# Patient Record
Sex: Female | Born: 1970 | Race: White | Hispanic: Yes | Marital: Single | Smoking: Never smoker
Health system: Southern US, Community
[De-identification: ages and names within clinical notes are randomized; demographics above are authoritative.]

## PROBLEM LIST (undated history)

## (undated) DIAGNOSIS — D490 Neoplasm of unspecified behavior of digestive system: Secondary | ICD-10-CM

## (undated) DIAGNOSIS — G8929 Other chronic pain: Secondary | ICD-10-CM

## (undated) DIAGNOSIS — R109 Unspecified abdominal pain: Secondary | ICD-10-CM

## (undated) DIAGNOSIS — K219 Gastro-esophageal reflux disease without esophagitis: Secondary | ICD-10-CM

## (undated) DIAGNOSIS — E079 Disorder of thyroid, unspecified: Secondary | ICD-10-CM

## (undated) HISTORY — PX: COLONOSCOPY: SHX174

---

## 2015-11-17 ENCOUNTER — Emergency Department (HOSPITAL_COMMUNITY)
Admission: EM | Admit: 2015-11-17 | Discharge: 2015-11-18 | Disposition: A | Discharge status: Home / Self Care | Payer: PRIVATE HEALTH INSURANCE | Attending: Emergency Medicine | Admitting: Emergency Medicine

## 2015-11-17 DIAGNOSIS — Z3202 Encounter for pregnancy test, result negative: Secondary | ICD-10-CM | POA: Insufficient documentation

## 2015-11-17 DIAGNOSIS — Z79899 Other long term (current) drug therapy: Secondary | ICD-10-CM | POA: Insufficient documentation

## 2015-11-17 DIAGNOSIS — K529 Noninfective gastroenteritis and colitis, unspecified: Secondary | ICD-10-CM | POA: Diagnosis not present

## 2015-11-17 DIAGNOSIS — R1012 Left upper quadrant pain: Secondary | ICD-10-CM | POA: Diagnosis present

## 2015-11-18 ENCOUNTER — Emergency Department (HOSPITAL_COMMUNITY): Payer: PRIVATE HEALTH INSURANCE

## 2015-11-18 ENCOUNTER — Encounter (HOSPITAL_COMMUNITY): Payer: Self-pay | Admitting: Family Medicine

## 2015-11-18 LAB — COMPREHENSIVE METABOLIC PANEL
ALBUMIN: 4.5 g/dL (ref 3.5–5.0)
ALK PHOS: 51 U/L (ref 38–126)
ALT: 13 U/L — ABNORMAL LOW (ref 14–54)
ANION GAP: 8 (ref 5–15)
AST: 19 U/L (ref 15–41)
BILIRUBIN TOTAL: 0.3 mg/dL (ref 0.3–1.2)
BUN: 14 mg/dL (ref 6–20)
CALCIUM: 9.5 mg/dL (ref 8.9–10.3)
CO2: 26 mmol/L (ref 22–32)
Chloride: 104 mmol/L (ref 101–111)
Creatinine, Ser: 0.87 mg/dL (ref 0.44–1.00)
Glucose, Bld: 97 mg/dL (ref 65–99)
POTASSIUM: 3.9 mmol/L (ref 3.5–5.1)
Sodium: 138 mmol/L (ref 135–145)
TOTAL PROTEIN: 7.8 g/dL (ref 6.5–8.1)

## 2015-11-18 LAB — CBC
HCT: 34.7 % — ABNORMAL LOW (ref 36.0–46.0)
Hemoglobin: 11.3 g/dL — ABNORMAL LOW (ref 12.0–15.0)
MCH: 28.3 pg (ref 26.0–34.0)
MCHC: 32.6 g/dL (ref 30.0–36.0)
MCV: 86.8 fL (ref 78.0–100.0)
Platelets: 298 10*3/uL (ref 150–400)
RBC: 4 MIL/uL (ref 3.87–5.11)
RDW: 13.5 % (ref 11.5–15.5)
WBC: 5.3 10*3/uL (ref 4.0–10.5)

## 2015-11-18 LAB — URINALYSIS, ROUTINE W REFLEX MICROSCOPIC
Bilirubin Urine: NEGATIVE
Glucose, UA: NEGATIVE mg/dL
Ketones, ur: NEGATIVE mg/dL
Leukocytes, UA: NEGATIVE
Nitrite: NEGATIVE
Protein, ur: NEGATIVE mg/dL
Specific Gravity, Urine: 1.024 (ref 1.005–1.030)
pH: 6.5 (ref 5.0–8.0)

## 2015-11-18 LAB — URINE MICROSCOPIC-ADD ON

## 2015-11-18 LAB — LIPASE, BLOOD: Lipase: 62 U/L — ABNORMAL HIGH (ref 11–51)

## 2015-11-18 LAB — POC URINE PREG, ED: PREG TEST UR: NEGATIVE

## 2015-11-18 MED ORDER — METOCLOPRAMIDE HCL 5 MG/ML IJ SOLN
10.0000 mg | INTRAMUSCULAR | Status: AC
  Administered 2015-11-18: 10 mg via INTRAVENOUS
  Filled 2015-11-18: qty 2

## 2015-11-18 MED ORDER — CIPROFLOXACIN HCL 500 MG PO TABS: 500.0000 mg | ORAL_TABLET | Freq: Two times a day (BID) | ORAL | Status: DC

## 2015-11-18 MED ORDER — SODIUM CHLORIDE 0.9 % IV BOLUS (SEPSIS)
1000.0000 mL | Freq: Once | INTRAVENOUS | Status: AC
  Administered 2015-11-18: 1000 mL via INTRAVENOUS

## 2015-11-18 MED ORDER — SUCRALFATE 1 G PO TABS
1.0000 g | ORAL_TABLET | Freq: Once | ORAL | Status: AC
  Administered 2015-11-18: 1 g via ORAL
  Filled 2015-11-18: qty 1

## 2015-11-18 MED ORDER — KETOROLAC TROMETHAMINE 30 MG/ML IJ SOLN
30.0000 mg | Freq: Once | INTRAMUSCULAR | Status: AC
  Administered 2015-11-18: 30 mg via INTRAVENOUS
  Filled 2015-11-18: qty 1

## 2015-11-18 MED ORDER — IOHEXOL 300 MG/ML  SOLN
100.0000 mL | Freq: Once | INTRAMUSCULAR | Status: AC | PRN
Start: 2015-11-18 — End: 2015-11-18
  Administered 2015-11-18: 100 mL via INTRAVENOUS

## 2015-11-18 MED ORDER — ACETAMINOPHEN 500 MG PO TABS: 500.0000 mg | ORAL_TABLET | Freq: Four times a day (QID) | ORAL | Status: AC | PRN

## 2015-11-18 MED ORDER — IOHEXOL 300 MG/ML  SOLN
50.0000 mL | Freq: Once | INTRAMUSCULAR | Status: AC | PRN
  Administered 2015-11-18: 50 mL via ORAL

## 2015-11-18 MED ORDER — METRONIDAZOLE 500 MG PO TABS: 500.0000 mg | ORAL_TABLET | Freq: Three times a day (TID) | ORAL | Status: DC

## 2015-11-18 NOTE — Discharge Instructions (Signed)
°  Colitis °Colitis is inflammation of the colon. Colitis may last a short time (acute) or it may last a long time (chronic). °CAUSES °This condition may be caused by: °· Viruses. °· Bacteria. °· Reactions to medicine. °· Certain autoimmune diseases, such as Crohn disease or ulcerative colitis. °SYMPTOMS °Symptoms of this condition include: °· Diarrhea. °· Passing bloody or tarry stool. °· Pain. °· Fever. °· Vomiting. °· Tiredness (fatigue). °· Weight loss. °· Bloating. °· Sudden increase in abdominal pain. °· Having fewer bowel movements than usual. °DIAGNOSIS °This condition is diagnosed with a stool test or a blood test. You may also have other tests, including X-rays, a CT scan, or a colonoscopy. °TREATMENT °Treatment may include: °· Resting the bowel. This involves not eating or drinking for a period of time. °· Fluids that are given through an IV tube. °· Medicine for pain and diarrhea. °· Antibiotic medicines. °· Cortisone medicines. °· Surgery. °HOME CARE INSTRUCTIONS °Eating and Drinking °· Follow instructions from your health care provider about eating or drinking restrictions. °· Drink enough fluid to keep your urine clear or pale yellow. °· Work with a dietitian to determine which foods cause your condition to flare up. °· Avoid foods that cause flare-ups. °· Eat a well-balanced diet. °Medicines °· Take over-the-counter and prescription medicines only as told by your health care provider. °· If you were prescribed an antibiotic medicine, take it as told by your health care provider. Do not stop taking the antibiotic even if you start to feel better. °General Instructions °· Keep all follow-up visits as told by your health care provider. This is important. °SEEK MEDICAL CARE IF: °· Your symptoms do not go away. °· You develop new symptoms. °SEEK IMMEDIATE MEDICAL CARE IF: °· You have a fever that does not go away with treatment. °· You develop chills. °· You have extreme weakness, fainting, or  dehydration. °· You have repeated vomiting. °· You develop severe pain in your abdomen. °· You pass bloody or tarry stool. °  °This information is not intended to replace advice given to you by your health care provider. Make sure you discuss any questions you have with your health care provider. °  °Document Released: 10/21/2004 Document Revised: 06/04/2015 Document Reviewed: 01/06/2015 °Elsevier Interactive Patient Education ©2016 [employer] ° °

## 2015-11-18 NOTE — ED Provider Notes (Signed)
CSN: [id number]     Arrival date & time 11/17/15  2351 History   First MD Initiated Contact with Patient 11/18/15 0151     Chief Complaint  Patient presents with  . Abdominal Pain     (Consider location/radiation/quality/duration/timing/severity/associated sxs/prior Treatment) HPI Comments: 45 year old G5P4 female presents to the emergency department for evaluation of worsening left upper quadrant abdominal pain. Patient reports having symptoms for one year, but pain has been worsening over the past few weeks. She notices aggravation of her symptoms with eating as well as when lifting heavy objects. She experienced nausea yesterday with one episode of emesis, but denies nausea or vomiting today. Patient has been followed by gastroenterology in [place] for similar symptoms. She has had a colonoscopy within the last year as well as an endoscopy 3-4 years ago. She reports multiple negative CT scans in 2014 and 2015. Patient's last bowel movement was yesterday and was normal for her. She denies vaginal complaints, diarrhea, fever, hematochezia, melena, and urinary symptoms. She is currently on Prilosec and Bentyl which she has been taking without relief. She denies a history of abdominal surgeries.  Patient is a 45 y.o. female presenting with abdominal pain. The history is provided by the patient. No language interpreter was used.  Abdominal Pain Associated symptoms: nausea and vomiting   Associated symptoms: no dysuria, no fever and no hematuria     History reviewed. No pertinent past medical history. History reviewed. No pertinent past surgical history. History reviewed. No pertinent family history. Social History  Substance Use Topics  . Smoking status: Never Smoker   . Smokeless tobacco: None  . Alcohol Use: Yes     Comment: Once a month   OB History    No data available      Review of Systems  Constitutional: Negative for fever.  Gastrointestinal: Positive for nausea,  vomiting and abdominal pain.  Genitourinary: Negative for dysuria and hematuria.  Neurological: Negative for syncope.  All other systems reviewed and are negative.   Allergies  Review of patient's allergies indicates no known allergies.  Home Medications   Prior to Admission medications   Medication Sig Start Date End Date Taking? Authorizing Provider  calcium carbonate (TUMS - DOSED IN MG ELEMENTAL CALCIUM) 500 MG chewable tablet Chew 1 tablet by mouth daily.   Yes Historical Provider, MD  dicyclomine (BENTYL) 10 MG capsule Take 10 mg by mouth 4 (four) times daily as needed for spasms.   Yes Historical Provider, MD  levothyroxine (SYNTHROID, LEVOTHROID) 75 MCG tablet Take 75 mcg by mouth daily before breakfast.   Yes Historical Provider, MD  omeprazole (PRILOSEC) 20 MG capsule Take 20 mg by mouth daily.   Yes Historical Provider, MD   BP 97/55 mmHg  Pulse 70  Temp(Src) 98.4 F (36.9 C) (Oral)  Resp 19  Ht 5' (1.524 m)  Wt 54.432 kg  BMI 23.44 kg/m2  SpO2 100%  LMP 10/27/2015   Physical Exam  Constitutional: She is oriented to person, place, and time. She appears well-developed and well-nourished. No distress.  Nontoxic/nonseptic appearing  HENT:  Head: Normocephalic and atraumatic.  Eyes: Conjunctivae and EOM are normal. No scleral icterus.  Neck: Normal range of motion.  Cardiovascular: Normal rate, regular rhythm and intact distal pulses.   Pulmonary/Chest: Effort normal. No respiratory distress. She has no wheezes.  Respirations even and unlabored  Abdominal: Soft. She exhibits no distension. There is tenderness. There is no rebound and no guarding.  Soft abdomen with mild focal tenderness  in the left upper quadrant. No voluntary or involuntary guarding. No masses or rigidity. No peritoneal signs.  Musculoskeletal: Normal range of motion.  Neurological: She is alert and oriented to person, place, and time. She exhibits normal muscle tone. Coordination normal.  Skin:  Skin is warm and dry. No rash noted. She is not diaphoretic. No erythema. No pallor.  Psychiatric: She has a normal mood and affect. Her behavior is normal.  Nursing note and vitals reviewed.   ED Course  Procedures (including critical care time) Labs Review Labs Reviewed  LIPASE, BLOOD - Abnormal; Notable for the following:    Lipase 62 (*)    All other components within normal limits  COMPREHENSIVE METABOLIC PANEL - Abnormal; Notable for the following:    ALT 13 (*)    All other components within normal limits  CBC - Abnormal; Notable for the following:    Hemoglobin 11.3 (*)    HCT 34.7 (*)    All other components within normal limits  URINALYSIS, ROUTINE W REFLEX MICROSCOPIC (NOT AT [facility]) - Abnormal; Notable for the following:    Hgb urine dipstick TRACE (*)    All other components within normal limits  URINE MICROSCOPIC-ADD ON - Abnormal; Notable for the following:    Squamous Epithelial / LPF 0-5 (*)    Bacteria, UA RARE (*)    All other components within normal limits  POC URINE PREG, ED    Imaging Review Ct Abdomen Pelvis W Contrast  11/18/2015  CLINICAL DATA:  Chronic left upper quadrant abdominal pain, nausea and vomiting. Microhematuria. Elevated lipase. Initial encounter. EXAM: CT ABDOMEN AND PELVIS WITH CONTRAST TECHNIQUE: Multidetector CT imaging of the abdomen and pelvis was performed using the standard protocol following bolus administration of intravenous contrast. CONTRAST:  114mL OMNIPAQUE IOHEXOL 300 MG/ML  SOLN COMPARISON:  None. FINDINGS: The visualized lung bases are clear. Scattered hypodensities within the liver measure up to 1.0 cm in size, and may reflect small cysts. The liver and spleen are otherwise unremarkable. The gallbladder is within normal limits. The pancreas and adrenal glands are unremarkable. The kidneys are unremarkable in appearance. There is no evidence of hydronephrosis. No renal or ureteral stones are seen. No perinephric stranding is  appreciated. No free fluid is identified. The small bowel is unremarkable in appearance. The stomach is within normal limits. No acute vascular abnormalities are seen. The appendix is normal in caliber and contains contrast, without evidence of appendicitis. Areas of mild wall thickening are noted along the hepatic flexure and transverse colon, and there is mild loss of the normal haustral pattern along the transverse and descending colon, concerning for mild infectious or inflammatory colitis. The bladder is mildly distended and grossly unremarkable. The uterus is grossly unremarkable in appearance, aside from suggestion of small uterine fibroids. The ovaries are relatively symmetric. No suspicious adnexal masses are seen. No inguinal lymphadenopathy is seen. No acute osseous abnormalities are identified. IMPRESSION: 1. Areas of mild wall thickening along the hepatic flexure and transverse colon, and mild loss of the normal haustral pattern along the transverse and descending colon, concerning for mild infectious or inflammatory colitis. 2. Suggestion of small uterine fibroids. 3. Nonspecific small hypodensities within the liver may reflect small cysts. Electronically Signed   By: Garald Balding M.D.   On: 11/18/2015 05:10     I have personally reviewed and evaluated these images and lab results as part of my medical decision-making.   EKG Interpretation None      MDM  Final diagnoses:  Colitis    45 year old female with a history of chronic left upper quadrant abdominal pain presents to the emergency department for evaluation of worsening pain. Patient states that this pain has been worse than the pain that she has experienced over the past year. Patient has been followed by a gastroenterologist in [place]. She seems fairly reasonable and reliable for follow-up. Patient's laboratory workup is noncontributory. CT abdomen pelvis obtained to evaluate for emergent/surgical cause of symptoms. CT  shows mild wall thickening along the hepatic flexure and transverse colon consistent with infectious versus inflammatory colitis. No evidence of bowel perforation or abscess.  Patient's symptoms have been fairly well controlled with Toradol, Carafate, and Reglan. She has also been given 1 L of IV fluids. She is resting comfortably on reexamination. I have discussed these results with the patient who verbalizes understanding. Will plan to discharge with a course of ciprofloxacin and Flagyl. Patient given referral to gastroenterology for further evaluation of her symptoms. Return precautions given at discharge. Patient agreeable to plan with no unaddressed concerns. Patient discharged in good condition; VSS.   Filed Vitals:   11/18/15 0142 11/18/15 0422 11/18/15 0430 11/18/15 0622  BP: 103/42 91/61 97/55  106/48  Pulse: 62 60 70 83  Temp:      TempSrc:      Resp: 18 15 19 18   Height:      Weight:      SpO2: 100% 100% 100% 100%     Antonietta Breach, PA-C 11/18/15 0658  April Palumbo, MD 11/18/15 [phone]

## 2015-11-18 NOTE — ED Notes (Signed)
Pt is complaining of left upper quad pain. Pt reports this pain has been occuring for the last year but severe in the last few weeks. Pt is in the middle of transitioning from [place] to [place]. Pt reports she has a gastroenterologist in [place]. Pt reports the pain is getting to severe and experiencing vomiting when eating.

## 2016-03-15 ENCOUNTER — Encounter (HOSPITAL_COMMUNITY): Payer: Self-pay

## 2016-03-15 ENCOUNTER — Emergency Department (HOSPITAL_COMMUNITY)
Admission: EM | Admit: 2016-03-15 | Discharge: 2016-03-16 | Disposition: A | Discharge status: Home / Self Care | Payer: Medicaid Other | Attending: Emergency Medicine | Admitting: Emergency Medicine

## 2016-03-15 DIAGNOSIS — R51 Headache: Secondary | ICD-10-CM | POA: Diagnosis not present

## 2016-03-15 DIAGNOSIS — R519 Headache, unspecified: Secondary | ICD-10-CM

## 2016-03-15 MED ORDER — PREDNISONE 20 MG PO TABS
60.0000 mg | ORAL_TABLET | Freq: Once | ORAL | Status: AC
  Administered 2016-03-16: 60 mg via ORAL
  Filled 2016-03-15: qty 3

## 2016-03-15 MED ORDER — TRAMADOL HCL 50 MG PO TABS: 50.0000 mg | ORAL_TABLET | Freq: Four times a day (QID) | ORAL | Status: DC | PRN

## 2016-03-15 MED ORDER — KETOROLAC TROMETHAMINE 60 MG/2ML IM SOLN
60.0000 mg | Freq: Once | INTRAMUSCULAR | Status: AC
  Administered 2016-03-16: 60 mg via INTRAMUSCULAR
  Filled 2016-03-15: qty 2

## 2016-03-15 MED ORDER — PROCHLORPERAZINE MALEATE 10 MG PO TABS
10.0000 mg | ORAL_TABLET | Freq: Two times a day (BID) | ORAL | Status: AC | PRN
Start: 2016-03-15 — End: ?

## 2016-03-15 NOTE — Discharge Instructions (Signed)

## 2016-03-15 NOTE — ED Provider Notes (Signed)
CSN: [id number]     Arrival date & time 03/15/16  1946 History  By signing my name below, I, Emmanuella Mensah, attest that this documentation has been prepared under the direction and in the presence of Orpah Greek, MD. Electronically Signed: Judithann Sauger, ED Scribe. 03/15/2016. 11:30 PM.    Chief Complaint  Patient presents with  . Headache   Patient is a 45 y.o. female presenting with headaches. The history is provided by the patient. No language interpreter was used.  Headache Pain location:  Occipital Onset quality:  Gradual Duration:  1 week Timing:  Constant Progression:  Worsening Chronicity:  Recurrent Relieved by:  None tried Worsened by:  Nothing Ineffective treatments:  None tried Associated symptoms: dizziness and weakness   Associated symptoms: no fever, no nausea and no vomiting    HPI Comments: Caitlin Mack is a 45 y.o. female who presents to the Emergency Department complaining of gradually worsening constant right occipital headache that radiates to the top of her head onset one week ago. She reports associated weakness to her legs and hands and dizziness (as if she had ETOH). No alleviating factors noted. She states that medication normally provides temporary relief but no medication PTA. No fever or n/v. Pt expresses worry about imaging due to multiple CT scans in the past.    History reviewed. No pertinent past medical history. History reviewed. No pertinent past surgical history. History reviewed. No pertinent family history. Social History  Substance Use Topics  . Smoking status: Never Smoker   . Smokeless tobacco: None  . Alcohol Use: Yes     Comment: Once a month   OB History    No data available     Review of Systems  Constitutional: Negative for fever.  Gastrointestinal: Negative for nausea and vomiting.  Neurological: Positive for dizziness, weakness and headaches.  All other systems reviewed and are negative.     Allergies   Review of patient's allergies indicates no known allergies.  Home Medications   Prior to Admission medications   Medication Sig Start Date End Date Taking? Authorizing Provider  amitriptyline (ELAVIL) 50 MG tablet Take 50 mg by mouth at bedtime.   Yes Historical Provider, MD  levothyroxine (SYNTHROID, LEVOTHROID) 75 MCG tablet Take 75 mcg by mouth daily before breakfast.   Yes Historical Provider, MD  acetaminophen (TYLENOL) 500 MG tablet Take 1 tablet (500 mg total) by mouth every 6 (six) hours as needed. Patient not taking: Reported on 03/15/2016 11/18/15   Antonietta Breach, PA-C  prochlorperazine (COMPAZINE) 10 MG tablet Take 1 tablet (10 mg total) by mouth 2 (two) times daily as needed (headache). 03/15/16   Orpah Greek, MD  traMADol (ULTRAM) 50 MG tablet Take 1 tablet (50 mg total) by mouth every 6 (six) hours as needed. 03/15/16   Orpah Greek, MD   BP 103/53 mmHg  Pulse 69  Temp(Src) 98.6 F (37 C) (Oral)  Resp 20  Ht 5\' 5"  (1.651 m)  Wt 130 lb 2 oz (59.024 kg)  BMI 21.65 kg/m2  SpO2 98% Physical Exam  Constitutional: She is oriented to person, place, and time. She appears well-developed and well-nourished. No distress.  HENT:  Head: Normocephalic and atraumatic.  Right Ear: Hearing normal.  Left Ear: Hearing normal.  Nose: Nose normal.  Mouth/Throat: Oropharynx is clear and moist and mucous membranes are normal.  Eyes: Conjunctivae and EOM are normal. Pupils are equal, round, and reactive to light.  Neck: Normal range of motion. Neck  supple.  Cardiovascular: Regular rhythm, S1 normal and S2 normal.  Exam reveals no gallop and no friction rub.   No murmur heard. Pulmonary/Chest: Effort normal and breath sounds normal. No respiratory distress. She exhibits no tenderness.  Abdominal: Soft. Normal appearance and bowel sounds are normal. There is no hepatosplenomegaly. There is no tenderness. There is no rebound, no guarding, no tenderness at McBurney's point and  negative Murphy's sign. No hernia.  Musculoskeletal: Normal range of motion.  Neurological: She is alert and oriented to person, place, and time. She has normal strength. No cranial nerve deficit or sensory deficit. Coordination normal. GCS eye subscore is 4. GCS verbal subscore is 5. GCS motor subscore is 6.  Skin: Skin is warm, dry and intact. No rash noted. No cyanosis.  Psychiatric: She has a normal mood and affect. Her speech is normal and behavior is normal. Thought content normal.  Nursing note and vitals reviewed.   ED Course  Procedures (including critical care time) DIAGNOSTIC STUDIES: Oxygen Saturation is 100% on RA, normal by my interpretation.    COORDINATION OF CARE: 11:17 PM- Pt advised of plan for treatment and pt agrees. Pt will receive Toradol and Prednisone. Will provide follow up to Neurology. Will prescribe Tramadol and Compazine.    Labs Review Labs Reviewed - No data to display  Imaging Review No results found.   Orpah Greek, MD has personally reviewed and evaluated these images and lab results as part of his medical decision-making.   EKG Interpretation None      MDM   Final diagnoses:  Headache, unspecified headache type      She presented to the emergency department for evaluation of dizziness, generalized weakness and headache. She reports that the headache has been present for 1 week. She has an entirely normal neurologic exam. Headache has been continuous without change. It was not a thunderclap in onset. This does not seem like a worrisome history. I did, however, discuss possible CT scan with the patient based on unusual headache history. She tells me that she has had many CAT scans in her lifetime and was told not to have anymore by her primary doctor in [place]. I do not feel that she absolutely warrants head CT based on her normal exam and benign presentation. She will therefore be treated empirically for headache and refer to  neurology for possible outpatient workup.  I personally performed the services described in this documentation, which was scribed in my presence. The recorded information has been reviewed and is accurate.   Orpah Greek, MD 03/15/16 [phone]

## 2016-03-15 NOTE — ED Notes (Signed)
Pt complaining of headaches radiating from right side of neck up to top of head. States some dizziness and weakness x 1 week.

## 2016-03-15 NOTE — ED Notes (Signed)
Patient also reports blurriness in L eye accompanied by dizziness and weakness x 1 week.

## 2016-03-17 ENCOUNTER — Emergency Department (HOSPITAL_COMMUNITY)
Admission: EM | Admit: 2016-03-17 | Discharge: 2016-03-18 | Disposition: A | Discharge status: Home / Self Care | Payer: Medicaid Other | Attending: Emergency Medicine | Admitting: Emergency Medicine

## 2016-03-17 ENCOUNTER — Encounter (HOSPITAL_COMMUNITY): Payer: Self-pay | Admitting: Emergency Medicine

## 2016-03-17 DIAGNOSIS — Z79899 Other long term (current) drug therapy: Secondary | ICD-10-CM | POA: Diagnosis not present

## 2016-03-17 DIAGNOSIS — R51 Headache: Secondary | ICD-10-CM | POA: Diagnosis not present

## 2016-03-17 DIAGNOSIS — R519 Headache, unspecified: Secondary | ICD-10-CM

## 2016-03-17 DIAGNOSIS — R42 Dizziness and giddiness: Secondary | ICD-10-CM | POA: Insufficient documentation

## 2016-03-17 LAB — CBC
HEMATOCRIT: 35.9 % — AB (ref 36.0–46.0)
HEMOGLOBIN: 11.6 g/dL — AB (ref 12.0–15.0)
MCH: 27 pg (ref 26.0–34.0)
MCHC: 32.3 g/dL (ref 30.0–36.0)
MCV: 83.7 fL (ref 78.0–100.0)
Platelets: 272 10*3/uL (ref 150–400)
RBC: 4.29 MIL/uL (ref 3.87–5.11)
RDW: 15.2 % (ref 11.5–15.5)
WBC: 6.9 10*3/uL (ref 4.0–10.5)

## 2016-03-17 LAB — BASIC METABOLIC PANEL
ANION GAP: 8 (ref 5–15)
BUN: 13 mg/dL (ref 6–20)
CALCIUM: 8.9 mg/dL (ref 8.9–10.3)
CO2: 23 mmol/L (ref 22–32)
Chloride: 107 mmol/L (ref 101–111)
Creatinine, Ser: 0.87 mg/dL (ref 0.44–1.00)
GFR calc non Af Amer: >60 mL/min (ref >60)
GLUCOSE: 96 mg/dL (ref 65–99)
POTASSIUM: 4.2 mmol/L (ref 3.5–5.1)
Sodium: 138 mmol/L (ref 135–145)

## 2016-03-17 MED ORDER — PROCHLORPERAZINE EDISYLATE 5 MG/ML IJ SOLN
5.0000 mg | Freq: Once | INTRAMUSCULAR | Status: AC
  Administered 2016-03-17: 5 mg via INTRAVENOUS
  Filled 2016-03-17: qty 2

## 2016-03-17 MED ORDER — IBUPROFEN 400 MG PO TABS
ORAL_TABLET | ORAL | Status: AC
  Filled 2016-03-17: qty 1

## 2016-03-17 MED ORDER — DEXAMETHASONE SODIUM PHOSPHATE 4 MG/ML IJ SOLN
10.0000 mg | Freq: Once | INTRAMUSCULAR | Status: AC
  Administered 2016-03-17: 10 mg via INTRAVENOUS
  Filled 2016-03-17: qty 3

## 2016-03-17 MED ORDER — IBUPROFEN 400 MG PO TABS
400.0000 mg | ORAL_TABLET | Freq: Once | ORAL | Status: AC | PRN
  Administered 2016-03-17: 400 mg via ORAL

## 2016-03-17 MED ORDER — SODIUM CHLORIDE 0.9 % IV BOLUS (SEPSIS)
1000.0000 mL | Freq: Once | INTRAVENOUS | Status: AC
  Administered 2016-03-17: 1000 mL via INTRAVENOUS

## 2016-03-17 NOTE — ED Provider Notes (Signed)
CSN: [id number]     Arrival date & time 03/17/16  1752 History   First MD Initiated Contact with Patient 03/17/16 2210     Chief Complaint  Patient presents with  . Dizziness     (Consider location/radiation/quality/duration/timing/severity/associated sxs/prior Treatment) Patient is a 45 y.o. female presenting with dizziness and headaches. The history is provided by the patient.  Dizziness Associated symptoms: headaches, tinnitus and weakness (generalized weakness first began in bilateral lower extremities, now in bilateral arms)   Associated symptoms: no chest pain, no diarrhea, no hearing loss, no nausea, no palpitations, no shortness of breath, no syncope, no vision changes and no vomiting   Headache Pain location:  Occipital Quality: began as sharp, now dull and achy. Radiates to: to right parietal scalp, and intermittently to right temple and behind right eye. Severity currently:  4/10 Severity at highest:  8/10 Onset quality:  Gradual Duration:  10 days Timing:  Constant Progression:  Unchanged Chronicity:  New Similar to prior headaches: no   Context: not activity, not exposure to bright light, not caffeine, not coughing, not intercourse, not loud noise and not straining   Relieved by: briefly and mildly improved with motrin and tylenol. Worsened by:  Nothing Associated symptoms: dizziness, eye pain, numbness and weakness (generalized weakness first began in bilateral lower extremities, now in bilateral arms)   Associated symptoms: no abdominal pain, no back pain, no blurred vision, no congestion, no cough, no diarrhea, no drainage, no ear pain, no facial pain, no fatigue, no fever, no focal weakness, no hearing loss, no loss of balance, no myalgias, no nausea, no near-syncope, no neck pain, no neck stiffness, no paresthesias, no photophobia, no seizures, no sinus pressure, no sore throat, no swollen glands, no syncope, no tingling, no URI, no visual change and no vomiting    Dizziness:    Severity:  Severe   Duration:  10 days   Timing:  Intermittent   Progression:  Worsening Risk factors: no anger, no family hx of SAH, does not have insomnia and lifestyle not sedentary     History reviewed. No pertinent past medical history. History reviewed. No pertinent past surgical history. No family history on file. Social History  Substance Use Topics  . Smoking status: Never Smoker   . Smokeless tobacco: None  . Alcohol Use: Yes     Comment: Once a month   OB History    No data available     Review of Systems  Constitutional: Negative for fever, chills, diaphoresis, activity change, appetite change and fatigue.  HENT: Positive for tinnitus. Negative for congestion, ear pain, hearing loss, postnasal drip, sinus pressure, sore throat, trouble swallowing and voice change.   Eyes: Positive for pain. Negative for blurred vision, photophobia, redness and visual disturbance.  Respiratory: Negative for cough, chest tightness, shortness of breath and wheezing.   Cardiovascular: Negative.  Negative for chest pain, palpitations, leg swelling, syncope and near-syncope.  Gastrointestinal: Negative.  Negative for nausea, vomiting, abdominal pain and diarrhea.  Endocrine: Negative.   Genitourinary: Negative.   Musculoskeletal: Negative.  Negative for myalgias, back pain, arthralgias, neck pain and neck stiffness.  Skin: Negative.  Negative for color change and pallor.  Neurological: Positive for dizziness, weakness (generalized weakness first began in bilateral lower extremities, now in bilateral arms), numbness and headaches. Negative for focal weakness, seizures, paresthesias and loss of balance.  Hematological: Negative.   Psychiatric/Behavioral: Negative.   All other systems reviewed and are negative.     Allergies  Review of patient's allergies indicates no known allergies.  Home Medications   Prior to Admission medications   Medication Sig Start Date End  Date Taking? Authorizing Provider  amitriptyline (ELAVIL) 50 MG tablet Take 50 mg by mouth at bedtime.   Yes Historical Provider, MD  levothyroxine (SYNTHROID, LEVOTHROID) 75 MCG tablet Take 75 mcg by mouth daily before breakfast.   Yes Historical Provider, MD  acetaminophen (TYLENOL) 500 MG tablet Take 1 tablet (500 mg total) by mouth every 6 (six) hours as needed. Patient not taking: Reported on 03/15/2016 11/18/15   Antonietta Breach, PA-C  meclizine (ANTIVERT) 12.5 MG tablet Take 2 tablets (25 mg total) by mouth 3 (three) times daily as needed for dizziness. 03/18/16   Delsa Grana, PA-C  prochlorperazine (COMPAZINE) 10 MG tablet Take 1 tablet (10 mg total) by mouth 2 (two) times daily as needed (headache). Patient not taking: Reported on 03/17/2016 03/15/16   Orpah Greek, MD  traMADol (ULTRAM) 50 MG tablet Take 1 tablet (50 mg total) by mouth every 6 (six) hours as needed. Patient not taking: Reported on 03/17/2016 03/15/16   Orpah Greek, MD   BP 117/76 mmHg  Pulse 73  Temp(Src) 97.6 F (36.4 C) (Oral)  Resp 15  SpO2 100% Physical Exam  Constitutional: She is oriented to person, place, and time. She appears well-developed and well-nourished. No distress.  HENT:  Head: Normocephalic and atraumatic.  Nose: Nose normal.  Mouth/Throat: Oropharynx is clear and moist. No oropharyngeal exudate.  Eyes: Conjunctivae and EOM are normal. Pupils are equal, round, and reactive to light. Right eye exhibits no discharge. Left eye exhibits no discharge. No scleral icterus.  Neck: Normal range of motion. No JVD present. No tracheal deviation present. No thyromegaly present.  Cardiovascular: Normal rate, regular rhythm, normal heart sounds and intact distal pulses.  Exam reveals no gallop and no friction rub.   No murmur heard. Pulmonary/Chest: Effort normal and breath sounds normal. No respiratory distress. She has no wheezes. She has no rales. She exhibits no tenderness.  Abdominal: Soft.  Bowel sounds are normal. She exhibits no distension and no mass. There is no tenderness. There is no rebound and no guarding.  Musculoskeletal: Normal range of motion. She exhibits no edema or tenderness.  Lymphadenopathy:    She has no cervical adenopathy.  Neurological: She is alert and oriented to person, place, and time. She has normal strength and normal reflexes. She is not disoriented. No cranial nerve deficit or sensory deficit. She exhibits normal muscle tone. She displays a negative Romberg sign. Coordination and gait normal.  Speech is clear and goal oriented, follows commands Major Cranial nerves without deficit, no facial droop Normal strength in upper and lower extremities bilaterally including dorsiflexion and plantar flexion, strong and equal grip strength Sensation normal to light and sharp touch Moves extremities without ataxia, coordination intact Normal finger to nose and rapid alternating movements Neg romberg, no pronator drift Normal gait and balance   Skin: Skin is warm and dry. No rash noted. She is not diaphoretic. No erythema. No pallor.  Psychiatric: She has a normal mood and affect. Her behavior is normal. Judgment and thought content normal.  Nursing note and vitals reviewed.   ED Course  Procedures (including critical care time) Labs Review Labs Reviewed  CBC - Abnormal; Notable for the following:    Hemoglobin 11.6 (*)    HCT 35.9 (*)    All other components within normal limits  BASIC METABOLIC PANEL  Imaging Review Ct Head Wo Contrast  03/18/2016  CLINICAL DATA:  45 year old female with occipital headache. EXAM: CT HEAD WITHOUT CONTRAST TECHNIQUE: Contiguous axial images were obtained from the base of the skull through the vertex without intravenous contrast. COMPARISON:  None. FINDINGS: The ventricles and the sulci are appropriate in size for the patient's age. There is no intracranial hemorrhage. No midline shift or mass effect identified. The  gray-white matter differentiation is preserved. The visualized paranasal sinuses and mastoid air cells are well aerated. The calvarium is intact. IMPRESSION: No acute intracranial pathology. Electronically Signed   By: Anner Crete M.D.   On: 03/18/2016 00:40   I have personally reviewed and evaluated these images and lab results as part of my medical decision-making.   EKG Interpretation None      MDM     Final diagnoses:  Headache, unspecified headache type  Vertigo   Patient seen in the ER for headache but ongoing for 1.5 weeks, she was evaluated 2 days ago, returns today after speaking with her PCP who suggested she have a CT scan of her brain. History of headache is not concerning for meningitis, subarachnoid hemorrhage.  She has a normal neurological exam, vital signs stable.  Labwork unremarkable. Negative head CT, patient able to ambulate without difficulty after oral Motrin, IV fluids, Decadron and Compazine. Discussed with pharmacy antivertigo medications for the patient to use safely with amitriptyline. Pharmacist advises meclizine, will give low-dose trial. Outpatient neurology follow-up intern also case management requested, to see if we can help establish local follow-up, patient is moving to the area from [place], has no local providers.  She was discharged home in good condition, steady gait, improved vertigo sx, VSS.   Delsa Grana, PA-C 03/21/16 0038  Jola Schmidt, MD 03/21/16 [phone]

## 2016-03-17 NOTE — ED Notes (Signed)
Pt states for the last week she has been having constant dizziness and numbness in both lower legs. Pt also reports a 8/10 headache. Pt states she was seen here 2 days ago for the same but refused a CT scan because she has had to "much radiation" in the last 2 years. However pt spoke with her doctor today and suggested she should come back for a CT.

## 2016-03-18 ENCOUNTER — Emergency Department (HOSPITAL_COMMUNITY): Payer: Medicaid Other

## 2016-03-18 MED ORDER — MECLIZINE HCL 12.5 MG PO TABS: 25.0000 mg | ORAL_TABLET | Freq: Three times a day (TID) | ORAL | Status: AC | PRN

## 2016-03-18 NOTE — Discharge Instructions (Signed)
General Headache Without Cause A headache is pain or discomfort felt around the head or neck area. The specific cause of a headache may not be found. There are many causes and types of headaches. A few common ones are:  Tension headaches.  Migraine headaches.  Cluster headaches.  Chronic daily headaches. HOME CARE INSTRUCTIONS  Watch your condition for any changes. Take these steps to help with your condition: Managing Pain  Take over-the-counter and prescription medicines only as told by your health care provider.  Lie down in a dark, quiet room when you have a headache.  If directed, apply ice to the head and neck area:  Put ice in a plastic bag.  Place a towel between your skin and the bag.  Leave the ice on for 20 minutes, 2-3 times per day.  Use a heating pad or hot shower to apply heat to the head and neck area as told by your health care provider.  Keep lights dim if bright lights bother you or make your headaches worse. Eating and Drinking  Eat meals on a regular schedule.  Limit alcohol use.  Decrease the amount of caffeine you drink, or stop drinking caffeine. General Instructions  Keep all follow-up visits as told by your health care provider. This is important.  Keep a headache journal to help find out what may trigger your headaches. For example, write down:  What you eat and drink.  How much sleep you get.  Any change to your diet or medicines.  Try massage or other relaxation techniques.  Limit stress.  Sit up straight, and do not tense your muscles.  Do not use tobacco products, including cigarettes, chewing tobacco, or e-cigarettes. If you need help quitting, ask your health care provider.  Exercise regularly as told by your health care provider.  Sleep on a regular schedule. Get 7-9 hours of sleep, or the amount recommended by your health care provider. SEEK MEDICAL CARE IF:   Your symptoms are not helped by medicine.  You have a  headache that is different from the usual headache.  You have nausea or you vomit.  You have a fever. SEEK IMMEDIATE MEDICAL CARE IF:   Your headache becomes severe.  You have repeated vomiting.  You have a stiff neck.  You have a loss of vision.  You have problems with speech.  You have pain in the eye or ear.  You have muscular weakness or loss of muscle control.  You lose your balance or have trouble walking.  You feel faint or pass out.  You have confusion.   This information is not intended to replace advice given to you by your health care provider. Make sure you discuss any questions you have with your health care provider.   Document Released: 09/13/2005 Document Revised: 06/04/2015 Document Reviewed: 01/06/2015 Elsevier Interactive Patient Education 2016 [employer]  Dizziness Dizziness is a common problem. It is a feeling of unsteadiness or light-headedness. You may feel like you are about to faint. Dizziness can lead to injury if you stumble or fall. Anyone can become dizzy, but dizziness is more common in older adults. This condition can be caused by a number of things, including medicines, dehydration, or illness. HOME CARE INSTRUCTIONS Taking these steps may help with your condition: Eating and Drinking  Drink enough fluid to keep your urine clear or pale yellow. This helps to keep you from becoming dehydrated. Try to drink more clear fluids, such as water.  Do not drink  alcohol.  Limit your caffeine intake if directed by your health care provider.  Limit your salt intake if directed by your health care provider. Activity  Avoid making quick movements.  Rise slowly from chairs and steady yourself until you feel okay.  In the morning, first sit up on the side of the bed. When you feel okay, stand slowly while you hold onto something until you know that your balance is fine.  Move your legs often if you need to stand in one place for a long time.  Tighten and relax your muscles in your legs while you are standing.  Do not drive or operate heavy machinery if you feel dizzy.  Avoid bending down if you feel dizzy. Place items in your home so that they are easy for you to reach without leaning over. Lifestyle  Do not use any tobacco products, including cigarettes, chewing tobacco, or electronic cigarettes. If you need help quitting, ask your health care provider.  Try to reduce your stress level, such as with yoga or meditation. Talk with your health care provider if you need help. General Instructions  Watch your dizziness for any changes.  Take medicines only as directed by your health care provider. Talk with your health care provider if you think that your dizziness is caused by a medicine that you are taking.  Tell a friend or a family member that you are feeling dizzy. If he or she notices any changes in your behavior, have this person call your health care provider.  Keep all follow-up visits as directed by your health care provider. This is important. SEEK MEDICAL CARE IF:  Your dizziness does not go away.  Your dizziness or light-headedness gets worse.  You feel nauseous.  You have reduced hearing.  You have new symptoms.  You are unsteady on your feet or you feel like the room is spinning. SEEK IMMEDIATE MEDICAL CARE IF:  You vomit or have diarrhea and are unable to eat or drink anything.  You have problems talking, walking, swallowing, or using your arms, hands, or legs.  You feel generally weak.  You are not thinking clearly or you have trouble forming sentences. It may take a friend or family member to notice this.  You have chest pain, abdominal pain, shortness of breath, or sweating.  Your vision changes.  You notice any bleeding.  You have a headache.  You have neck pain or a stiff neck.  You have a fever.   This information is not intended to replace advice given to you by your health care  provider. Make sure you discuss any questions you have with your health care provider.   Document Released: 03/09/2001 Document Revised: 01/28/2015 Document Reviewed: 09/09/2014 Elsevier Interactive Patient Education 2016 [employer].  Analgesic Rebound Headaches An analgesic rebound headache is a headache that returns after pain medicine (analgesic) that was taken to treat the initial headache wears off. People who suffer from tension, migraine, or cluster headaches are at risk for developing rebound headaches. Any type of primary headache can return as a rebound headache if you regularly take analgesics more than three times a week. If the cycle of rebound headaches continues, they become chronic daily headaches.  CAUSES Analgesics frequently associated with this problem include common over-the-counter medicines like aspirin, ibuprofen, acetaminophen, sinus relief medicines, and other medicines that contain caffeine. Narcotic pain medicines are also a common cause of rebound headaches.  SIGNS AND SYMPTOMS The symptoms of rebound headaches are the same  as the symptoms of your initial headache. Symptoms of specific types of headaches include: Tension headache  Pressure around the head.  Dull, aching head pain.  Pain felt over the front and sides of the head.  Tenderness in the muscles of the head, neck and shoulders. Migraine Headache  Pulsing or throbbing pain on one or both sides of the head.  Severe pain that interferes with daily activities.  Pain that is worsened by physical activity.  Nausea, vomiting, or both.  Pain with exposure to bright light, loud noises, or strong smells.  General sensitivity to bright light, loud noises, or strong smells.  Visual changes.  Numbness of one or both arms. Cluster Headaches  Severe pain that begins in or around one eye or temple.  Redness in the eye on the same side as the pain.  Droopy or swollen eyelid.  One-sided head  pain.  Nausea.  Runny nose.  Sweaty, pale facial skin.  Restlessness. DIAGNOSIS  Analgesic rebound headaches are diagnosed by reviewing your medical history. This includes the nature of your initial headaches, as well as the type of pain medicines you have been using to treat your headaches and how often you take them. TREATMENT Discontinuing frequent use of the analgesic medicine will typically reduce the frequency of the rebound episodes. This may initially worsen your headaches but eventually the pain should become more manageable, less frequent, and less severe.  Seeing a headache specialists may helpful. He or she may be able to help you manage your headaches and to make sure there is not another cause of the headaches. Alternative methods of stress relief such as acupuncture, counseling, biofeedback, and massage may also be helpful. Talk with your health care provider about which alternative treatments might be good for you. HOME CARE INSTRUCTIONS Stopping the regular use of pain medicine can be difficult. Follow your health care provider's instructions carefully. Keep all of your appointments. Avoid triggers that are known to cause your primary headaches. SEEK MEDICAL CARE IF: You continue to experience headaches after following your health care provider's recommended treatments. SEEK IMMEDIATE MEDICAL CARE IF:  You develop new headache pain.  You develop headache pain that is different than what you have experienced in the past.  You develop numbness or tingling in your arms or legs.  You develop changes in your speech or vision. MAKE SURE YOU:  Understand these instructions.  Will watch your child's condition.  Will get help right away if your child is not doing well or gets worse.   This information is not intended to replace advice given to you by your health care provider. Make sure you discuss any questions you have with your health care provider.   Document  Released: 12/04/2003 Document Revised: 10/04/2014 Document Reviewed: 03/29/2013 Elsevier Interactive Patient Education [employer].

## 2016-03-18 NOTE — ED Notes (Signed)
Pt ambulated in hall. No assistance necessary. Steady on feet

## 2016-06-16 ENCOUNTER — Encounter (HOSPITAL_COMMUNITY): Payer: Self-pay | Admitting: Emergency Medicine

## 2016-06-16 DIAGNOSIS — Z5321 Procedure and treatment not carried out due to patient leaving prior to being seen by health care provider: Secondary | ICD-10-CM | POA: Insufficient documentation

## 2016-06-16 DIAGNOSIS — R109 Unspecified abdominal pain: Secondary | ICD-10-CM | POA: Diagnosis not present

## 2016-06-16 LAB — COMPREHENSIVE METABOLIC PANEL
ALK PHOS: 60 U/L (ref 38–126)
ALT: 22 U/L (ref 14–54)
AST: 28 U/L (ref 15–41)
Albumin: 4 g/dL (ref 3.5–5.0)
Anion gap: 6 (ref 5–15)
BILIRUBIN TOTAL: 0.2 mg/dL — AB (ref 0.3–1.2)
BUN: 8 mg/dL (ref 6–20)
CALCIUM: 9.7 mg/dL (ref 8.9–10.3)
CHLORIDE: 104 mmol/L (ref 101–111)
CO2: 27 mmol/L (ref 22–32)
CREATININE: 0.81 mg/dL (ref 0.44–1.00)
Glucose, Bld: 93 mg/dL (ref 65–99)
Potassium: 3.8 mmol/L (ref 3.5–5.1)
Sodium: 137 mmol/L (ref 135–145)
TOTAL PROTEIN: 7.2 g/dL (ref 6.5–8.1)

## 2016-06-16 LAB — URINE MICROSCOPIC-ADD ON

## 2016-06-16 LAB — CBC
HCT: 37.5 % (ref 36.0–46.0)
Hemoglobin: 12.3 g/dL (ref 12.0–15.0)
MCH: 28.4 pg (ref 26.0–34.0)
MCHC: 32.8 g/dL (ref 30.0–36.0)
MCV: 86.6 fL (ref 78.0–100.0)
PLATELETS: 284 10*3/uL (ref 150–400)
RBC: 4.33 MIL/uL (ref 3.87–5.11)
RDW: 13.9 % (ref 11.5–15.5)
WBC: 8.3 10*3/uL (ref 4.0–10.5)

## 2016-06-16 LAB — URINALYSIS, ROUTINE W REFLEX MICROSCOPIC
Bilirubin Urine: NEGATIVE
Glucose, UA: NEGATIVE mg/dL
KETONES UR: NEGATIVE mg/dL
LEUKOCYTES UA: NEGATIVE
NITRITE: NEGATIVE
PROTEIN: NEGATIVE mg/dL
Specific Gravity, Urine: 1.017 (ref 1.005–1.030)
pH: 6 (ref 5.0–8.0)

## 2016-06-16 LAB — POC URINE PREG, ED: PREG TEST UR: NEGATIVE

## 2016-06-16 LAB — LIPASE, BLOOD: LIPASE: 43 U/L (ref 11–51)

## 2016-06-16 NOTE — ED Triage Notes (Signed)
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 Reports she just got approved for medicaid and thus wants to be seen here for continuing pain. Reports her PCP wants her to get an MRI. Reports she usually takes tylenol and bentyl not effective. Reports today pain affecting her mobility.

## 2016-06-17 ENCOUNTER — Emergency Department (HOSPITAL_COMMUNITY)
Admission: EM | Admit: 2016-06-17 | Discharge: 2016-06-17 | Disposition: A | Discharge status: Home / Self Care | Payer: Medicaid Other | Attending: Emergency Medicine | Admitting: Emergency Medicine

## 2016-06-17 HISTORY — DX: Neoplasm of unspecified behavior of digestive system: D49.0

## 2016-06-17 HISTORY — DX: Disorder of thyroid, unspecified: E07.9

## 2016-06-17 HISTORY — DX: Gastro-esophageal reflux disease without esophagitis: K21.9

## 2016-06-17 HISTORY — DX: Other chronic pain: G89.29

## 2016-06-17 HISTORY — DX: Unspecified abdominal pain: R10.9

## 2016-06-17 NOTE — ED Notes (Signed)
Called pt name three times to be roomed to D33. No response.

## 2016-06-17 NOTE — ED Notes (Signed)
Called PT name 4x in waiting area, no response-TY

## 2016-09-20 ENCOUNTER — Encounter (HOSPITAL_COMMUNITY): Payer: Self-pay

## 2016-09-20 ENCOUNTER — Emergency Department (HOSPITAL_COMMUNITY)
Admission: EM | Admit: 2016-09-20 | Discharge: 2016-09-20 | Disposition: A | Discharge status: Home / Self Care | Payer: Medicaid Other | Attending: Emergency Medicine | Admitting: Emergency Medicine

## 2016-09-20 ENCOUNTER — Emergency Department (HOSPITAL_COMMUNITY): Payer: Medicaid Other

## 2016-09-20 DIAGNOSIS — R1011 Right upper quadrant pain: Secondary | ICD-10-CM | POA: Diagnosis not present

## 2016-09-20 DIAGNOSIS — Z79899 Other long term (current) drug therapy: Secondary | ICD-10-CM | POA: Insufficient documentation

## 2016-09-20 DIAGNOSIS — E039 Hypothyroidism, unspecified: Secondary | ICD-10-CM | POA: Insufficient documentation

## 2016-09-20 DIAGNOSIS — R10811 Right upper quadrant abdominal tenderness: Secondary | ICD-10-CM

## 2016-09-20 LAB — CBC WITH DIFFERENTIAL/PLATELET
BASOS PCT: 1 %
Basophils Absolute: 0 10*3/uL (ref 0.0–0.1)
EOS ABS: 0.1 10*3/uL (ref 0.0–0.7)
Eosinophils Relative: 2 %
HEMATOCRIT: 35.3 % — AB (ref 36.0–46.0)
HEMOGLOBIN: 11.6 g/dL — AB (ref 12.0–15.0)
LYMPHS ABS: 1 10*3/uL (ref 0.7–4.0)
Lymphocytes Relative: 24 %
MCH: 26.8 pg (ref 26.0–34.0)
MCHC: 32.9 g/dL (ref 30.0–36.0)
MCV: 81.5 fL (ref 78.0–100.0)
MONO ABS: 0.5 10*3/uL (ref 0.1–1.0)
MONOS PCT: 11 %
NEUTROS PCT: 62 %
Neutro Abs: 2.7 10*3/uL (ref 1.7–7.7)
Platelets: 260 10*3/uL (ref 150–400)
RBC: 4.33 MIL/uL (ref 3.87–5.11)
RDW: 14.1 % (ref 11.5–15.5)
WBC: 4.3 10*3/uL (ref 4.0–10.5)

## 2016-09-20 LAB — URINALYSIS, ROUTINE W REFLEX MICROSCOPIC
BILIRUBIN URINE: NEGATIVE
Glucose, UA: NEGATIVE mg/dL
Hgb urine dipstick: NEGATIVE
KETONES UR: NEGATIVE mg/dL
Leukocytes, UA: NEGATIVE
NITRITE: NEGATIVE
PROTEIN: NEGATIVE mg/dL
Specific Gravity, Urine: 1.004 — ABNORMAL LOW (ref 1.005–1.030)
pH: 7 (ref 5.0–8.0)

## 2016-09-20 LAB — COMPREHENSIVE METABOLIC PANEL
ALBUMIN: 3.9 g/dL (ref 3.5–5.0)
ALT: 15 U/L (ref 14–54)
ANION GAP: 8 (ref 5–15)
AST: 22 U/L (ref 15–41)
Alkaline Phosphatase: 45 U/L (ref 38–126)
BILIRUBIN TOTAL: 0.3 mg/dL (ref 0.3–1.2)
BUN: 11 mg/dL (ref 6–20)
CALCIUM: 9.2 mg/dL (ref 8.9–10.3)
CO2: 24 mmol/L (ref 22–32)
Chloride: 107 mmol/L (ref 101–111)
Creatinine, Ser: 0.79 mg/dL (ref 0.44–1.00)
GFR calc non Af Amer: >60 mL/min (ref >60)
Glucose, Bld: 95 mg/dL (ref 65–99)
POTASSIUM: 4.5 mmol/L (ref 3.5–5.1)
SODIUM: 139 mmol/L (ref 135–145)
TOTAL PROTEIN: 6.8 g/dL (ref 6.5–8.1)

## 2016-09-20 LAB — LIPASE, BLOOD: Lipase: 38 U/L (ref 11–51)

## 2016-09-20 LAB — POC URINE PREG, ED: PREG TEST UR: NEGATIVE

## 2016-09-20 MED ORDER — TRAMADOL HCL 50 MG PO TABS: 50.0000 mg | ORAL_TABLET | Freq: Four times a day (QID) | ORAL | 0 refills | Status: AC | PRN

## 2016-09-20 MED ORDER — MORPHINE SULFATE (PF) 4 MG/ML IV SOLN
4.0000 mg | Freq: Once | INTRAVENOUS | Status: AC
Start: 2016-09-20 — End: 2016-09-20
  Administered 2016-09-20: 4 mg via INTRAVENOUS
  Filled 2016-09-20: qty 1

## 2016-09-20 MED ORDER — ONDANSETRON HCL 4 MG/2ML IJ SOLN: 4.0000 mg | Freq: Once | INTRAMUSCULAR | Status: DC

## 2016-09-20 MED ORDER — ONDANSETRON HCL 4 MG/2ML IJ SOLN
4.0000 mg | Freq: Once | INTRAMUSCULAR | Status: AC
Start: 2016-09-20 — End: 2016-09-20
  Administered 2016-09-20: 4 mg via INTRAVENOUS
  Filled 2016-09-20: qty 2

## 2016-09-20 MED ORDER — ONDANSETRON 4 MG PO TBDP: 4.0000 mg | ORAL_TABLET | Freq: Three times a day (TID) | ORAL | 0 refills | Status: AC | PRN

## 2016-09-20 MED ORDER — ONDANSETRON 4 MG PO TBDP
4.0000 mg | ORAL_TABLET | Freq: Once | ORAL | Status: AC
  Administered 2016-09-20: 4 mg via ORAL

## 2016-09-20 MED ORDER — ONDANSETRON 4 MG PO TBDP
8.0000 mg | ORAL_TABLET | Freq: Once | ORAL | Status: DC
  Filled 2016-09-20: qty 2

## 2016-09-20 NOTE — ED Triage Notes (Addendum)
Per Pt, Pt is coming from home with complaints of RUQ pain that started about six months ago. Pt reports that it has worsened and the pain is now radiating to her back. Denies vomiting or diarrhea, but reports nausea. Pt is increased with movement, and reports increase in fatigue. Pt was diagnosed with Liver tumor, and reports PCP has not rechecked.

## 2016-09-20 NOTE — ED Notes (Signed)
Patient transported to Ultrasound 

## 2016-09-20 NOTE — Discharge Instructions (Signed)
Take your medication as prescribed as have for pain relief. I recommend following up with your gastroenterologist at your scheduled appointment for follow up evaluation of your right upper quadrant abdominal pain.  I also recommend having a repeat ultrasound preformed in 6 months for further evaluation of your 1 cm hyperechoic liver lesion. Please return to the Emergency Department if symptoms worsen or new onset of fever, new/worsening abdominal pain, vomiting, unable to keep fluids down.

## 2016-09-20 NOTE — ED Provider Notes (Signed)
[facility] DEPT Provider Note   CSN: [id number] Arrival date & time: 09/20/16  1010     History   Chief Complaint Chief Complaint  Patient presents with  . Abdominal Pain    HPI Caitlin Mack is a 45 y.o. female.  HPI   Patient is a 45 year old female with history of GERD who presents to the ED with complaint of right upper abdominal pain. Patient reports she has had constant sharp aching pain to her right upper quadrant for the past 6 months but states it has significantly worsened over the past few days. She reports also having sharp intermittent pain to the area that radiates around to her back. Patient reports pain is worse when bending forward or turning sideways. She also endorses associated chills, night sweats, nausea and fatigue that occurs when she is walking due to worsening abdominal pain. Patient reports she recently moved from [place] but states she has established care with a PCP and GI locally. 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 Patient states she has been taking Tylenol, ibuprofen, Bentyl and Prilosec at home without relief of symptoms. Denies fever, cough, shortness of breath, chest pain, vomiting, diarrhea, constipation, urinary symptoms, vaginal bleeding, vaginal discharge. Last menstrual period 3 weeks ago. Endorses abdominal surgical history of tubal ligation.  Past Medical History:  Diagnosis Date  . Chronic abdominal pain   . GERD (gastroesophageal reflux disease)   . Liver tumor   . Thyroid disease    hypothyroid    There are no active problems to display for this patient.   Past Surgical History:  Procedure Laterality Date  . COLONOSCOPY      OB History    No data available       Home Medications    Prior to Admission medications   Medication Sig Start Date End Date Taking? Authorizing Provider  acetaminophen (TYLENOL) 500  MG tablet Take 1 tablet (500 mg total) by mouth every 6 (six) hours as needed. Patient not taking: Reported on 03/15/2016 11/18/15   Antonietta Breach, PA-C  amitriptyline (ELAVIL) 50 MG tablet Take 50 mg by mouth at bedtime.    Historical Provider, MD  levothyroxine (SYNTHROID, LEVOTHROID) 75 MCG tablet Take 75 mcg by mouth daily before breakfast.    Historical Provider, MD  meclizine (ANTIVERT) 12.5 MG tablet Take 2 tablets (25 mg total) by mouth 3 (three) times daily as needed for dizziness. 03/18/16   Delsa Grana, PA-C  prochlorperazine (COMPAZINE) 10 MG tablet Take 1 tablet (10 mg total) by mouth 2 (two) times daily as needed (headache). Patient not taking: Reported on 03/17/2016 03/15/16   Orpah Greek, MD  traMADol (ULTRAM) 50 MG tablet Take 1 tablet (50 mg total) by mouth every 6 (six) hours as needed. Patient not taking: Reported on 03/17/2016 03/15/16   Orpah Greek, MD    Family History No family history on file.  Social History Social History  Substance Use Topics  . Smoking status: Never Smoker  . Smokeless tobacco: Never Used  . Alcohol use Yes     Comment: Once a month, Occasionally      Allergies   Patient has no known allergies.   Review of Systems Review of Systems  Constitutional: Positive for chills and fatigue.  Gastrointestinal: Positive for abdominal pain and nausea.  Musculoskeletal: Positive for back pain.  All other systems reviewed and are negative.  Physical Exam Updated Vital Signs BP (!) 122/46 (BP Location: Right Arm)   Pulse 93   Temp 98.1 F (36.7 C) (Oral)   Resp 18   Ht 5' (1.524 m)   Wt 62.6 kg   LMP 08/30/2016   SpO2 100%   BMI 26.95 kg/m   Physical Exam  Constitutional: She is oriented to person, place, and time. She appears well-developed and well-nourished. No distress.  HENT:  Head: Normocephalic and atraumatic.  Mouth/Throat: Uvula is midline, oropharynx is clear and moist and mucous membranes are normal. No  oropharyngeal exudate, posterior oropharyngeal edema, posterior oropharyngeal erythema or tonsillar abscesses. No tonsillar exudate.  Eyes: Conjunctivae and EOM are normal. Right eye exhibits no discharge. Left eye exhibits no discharge. No scleral icterus.  Neck: Normal range of motion. Neck supple.  Cardiovascular: Normal rate, regular rhythm, normal heart sounds and intact distal pulses.   Pulmonary/Chest: Effort normal and breath sounds normal. No respiratory distress. She has no wheezes. She has no rales. She exhibits no tenderness.  Abdominal: Soft. Bowel sounds are normal. She exhibits no distension and no mass. There is tenderness in the right upper quadrant. There is positive Murphy's sign. There is no rigidity, no rebound, no guarding and no CVA tenderness. No hernia.  Musculoskeletal: Normal range of motion. She exhibits no edema.  No midline C, T, or L tenderness. Full range of motion of neck and back. Full range of motion of bilateral upper and lower extremities, with 5/5 strength. Sensation intact. 2+ radial and PT pulses. Cap refill <2 seconds. Patient able to stand and ambulate without assistance.    Neurological: She is alert and oriented to person, place, and time.  Skin: Skin is warm and dry. She is not diaphoretic.  Nursing note and vitals reviewed.    ED Treatments / Results  Labs (all labs ordered are listed, but only abnormal results are displayed) Labs Reviewed - No data to display  EKG  EKG Interpretation None       Radiology No results found.  Procedures Procedures (including critical care time)  Medications Ordered in ED Medications  ondansetron (ZOFRAN-ODT) disintegrating tablet 8 mg (not administered)  ondansetron (ZOFRAN) injection 4 mg (not administered)  morphine 4 MG/ML injection 4 mg (not administered)     Initial Impression / Assessment and Plan / ED Course  I have reviewed the triage vital signs and the nursing notes.  Pertinent labs &  imaging results that were available during my care of the patient were reviewed by me and considered in my medical decision making (see chart for details).  Clinical Course     Patient presents with continued right upper quadrant pain which she has had for the past 6 months that has gradually worsened over the past few days. Endorses associated nausea. Denies fever. VSS. Exam revealed tenderness over right upper quadrant with positive Murphy's sign. Remaining exam unremarkable. Patient given pain meds and antiemetics.  Chart review shows CT abdomen performed on 11/18/15 revealed signs of mild infectious or inflammatory colitis, small uterine fibroids and nonspecific small hypodensities within the liver which may reflect small cysts.  Pregnancy negative. Labs and urine unremarkable. Right upper quadrant ultrasound revealed unremarkable gallbladder, 2 small unchanged hepatic cysts, and 1 cm hyperechoic liver lesion which appears not significantly changed in size from prior CT, suspect hemangioma due to patient denying any history of cancer. On reevaluation patient reports significant improvement of symptoms. Discussed results with patient. Patient reports she has a follow-up appointment scheduled  with her gastroenterologist on 10/23/15. Advised patient to follow up at her scheduled appointment. Recommended follow-up ultrasound performed in 6 months for further evaluation of liver lesion. Plan to discharge patient home with symptomatic treatment. Discussed return precautions.  Final Clinical Impressions(s) / ED Diagnoses   Final diagnoses:  RUQ abdominal tenderness    New Prescriptions New Prescriptions   No medications on file     Nona Dell, PA-C 09/20/16 1227    Dorie Rank, MD 09/23/16 1012

## 2016-09-20 NOTE — ED Notes (Signed)
PA at bedside updating patient on results and plan of care.

## 2016-09-20 NOTE — ED Notes (Signed)
EDP at bedside  

## 2017-07-20 IMAGING — CT CT HEAD W/O CM
3 of 4 series · 16 of 47 positions shown, 19 images · non-contrast
Comparison: None.

CLINICAL DATA: 44-year-old female with occipital headache.

EXAM:
CT HEAD WITHOUT CONTRAST
TECHNIQUE: Contiguous axial images were obtained from the base of the skull
through the vertex without intravenous contrast.

[Series 3: head 2.0 h70h · axial · 0.47mm/px · z∈[-75,+75]mm · 10 of 85 slices shown, 13 images]
[im 5/85  brain]
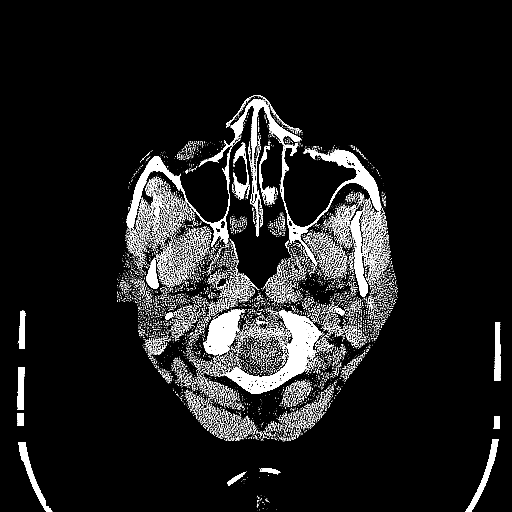
[im 5/85  bone]
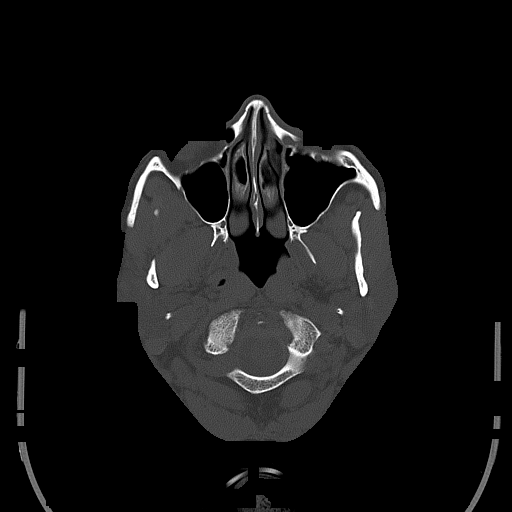
[im 13/85  brain]
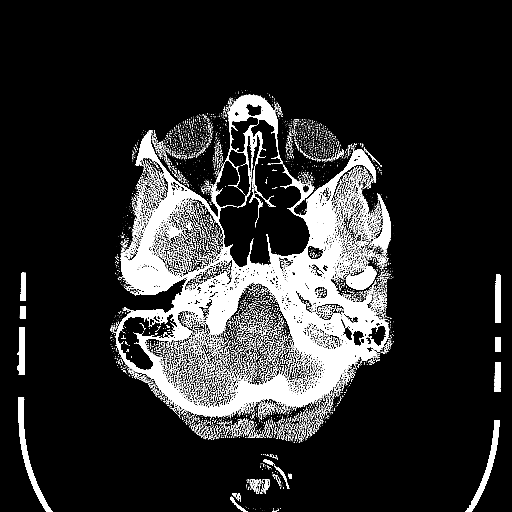
[im 22/85  brain]
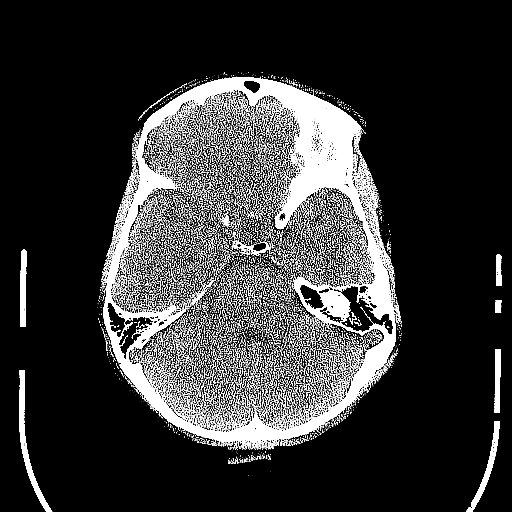
[im 30/85  brain]
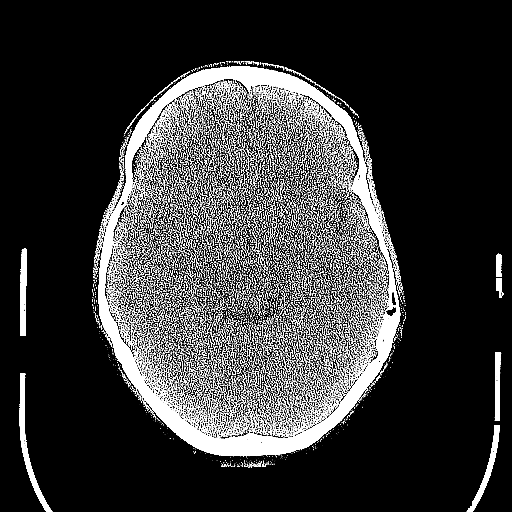
[im 38/85  brain]
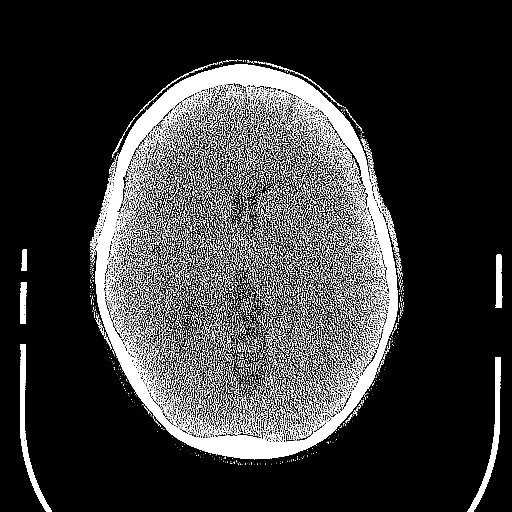
[im 38/85  bone]
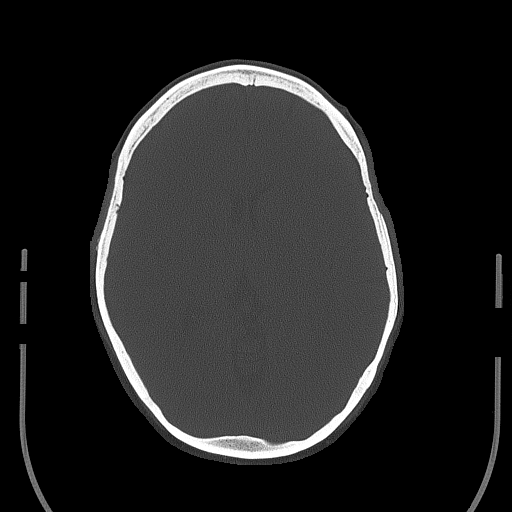
[im 47/85  brain]
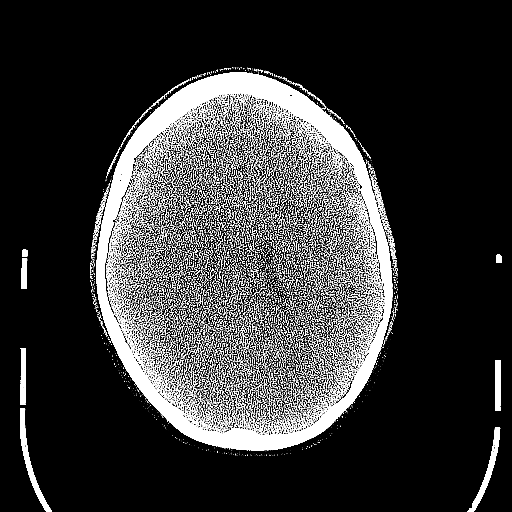
[im 55/85  brain]
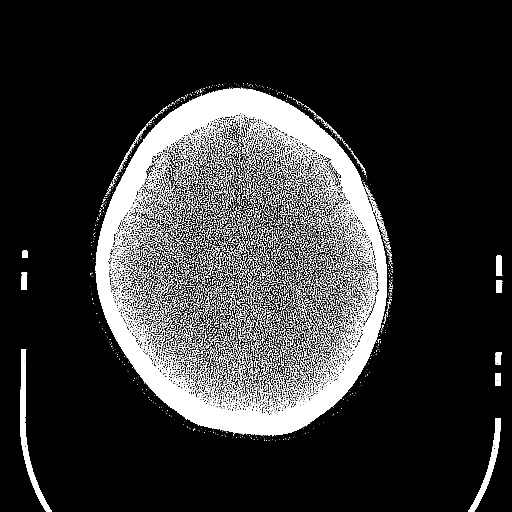
[im 64/85  brain]
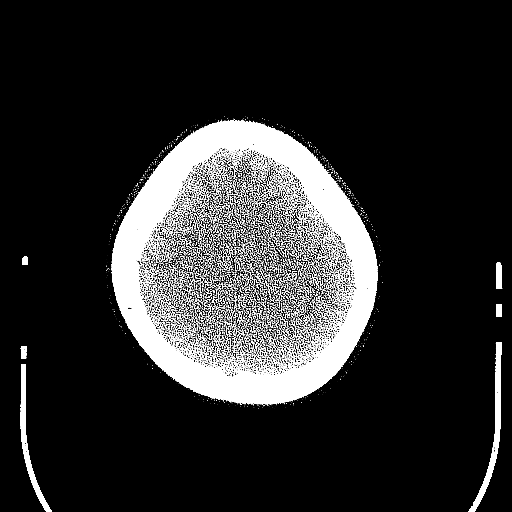
[im 72/85  brain]
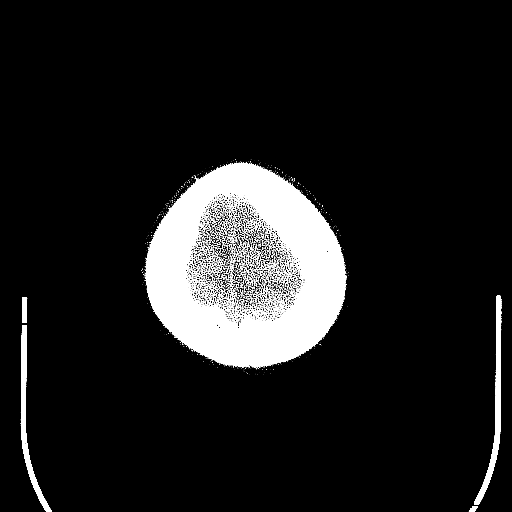
[im 72/85  bone]
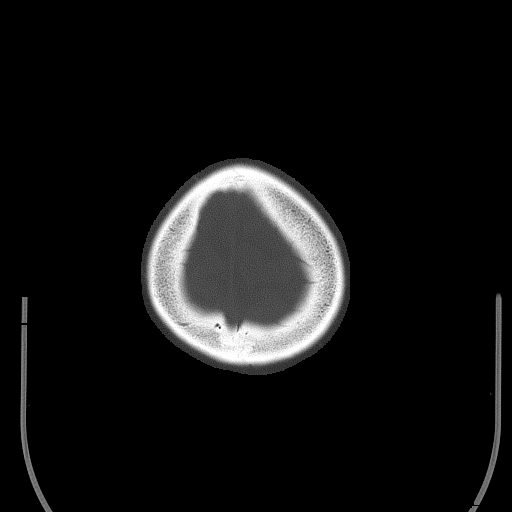
[im 80/85  brain]
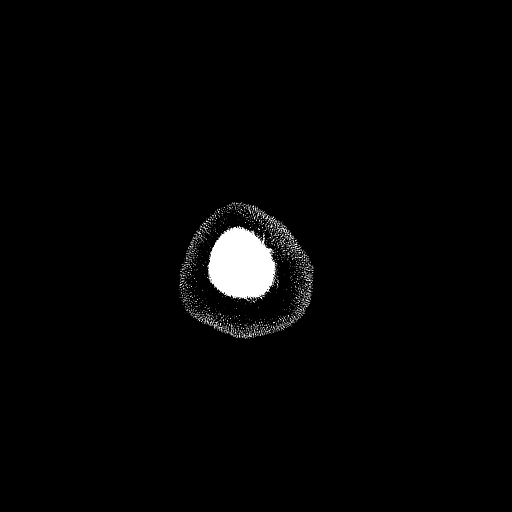

[Series 4: head 3.0 mpr · coronal · 0.35mm/px · 3 of 72 slices shown (1 of 2)]
[im 24/72  brain]
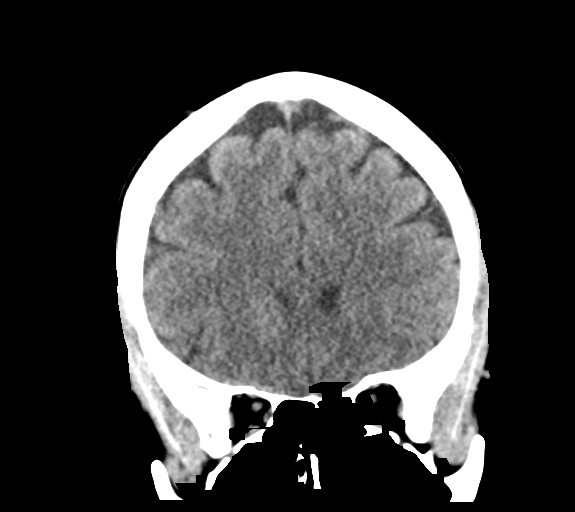
[im 32/72  brain]
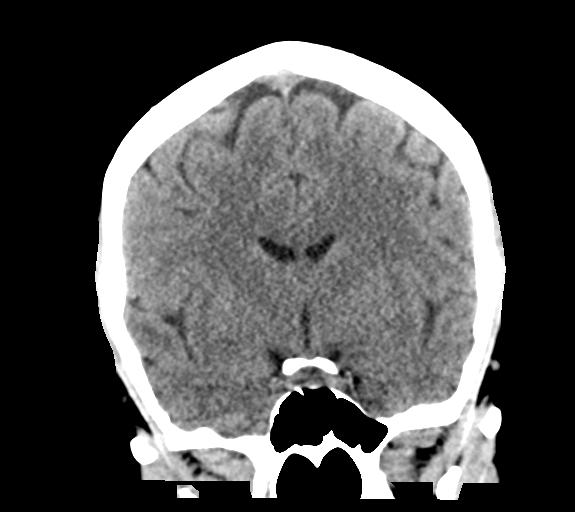
[im 40/72  brain]
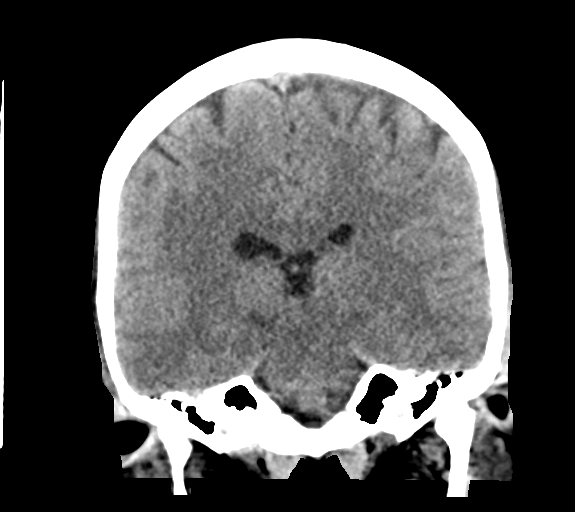

[Series 5: head 3.0 mpr · sagittal · 0.35mm/px · 3 of 66 slices shown (2 of 2)]
[im 22/66  brain]
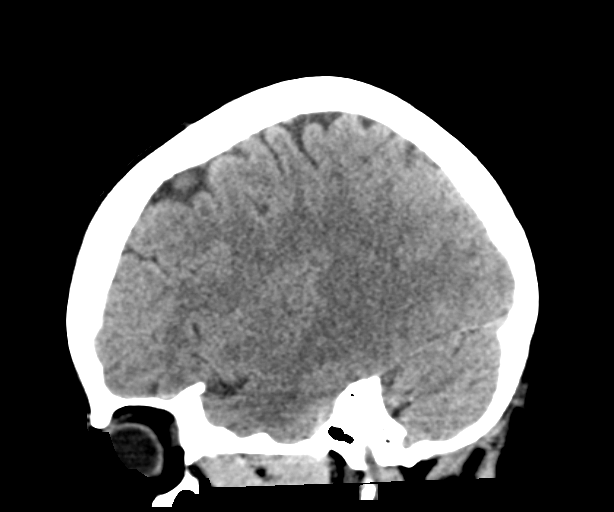
[im 33/66  brain]
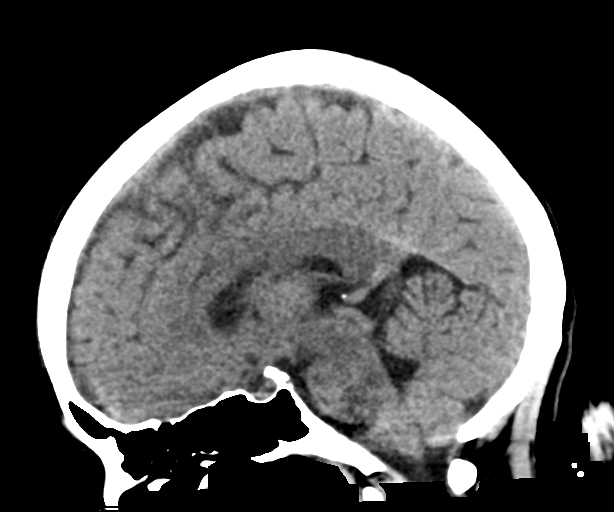
[im 44/66  brain]
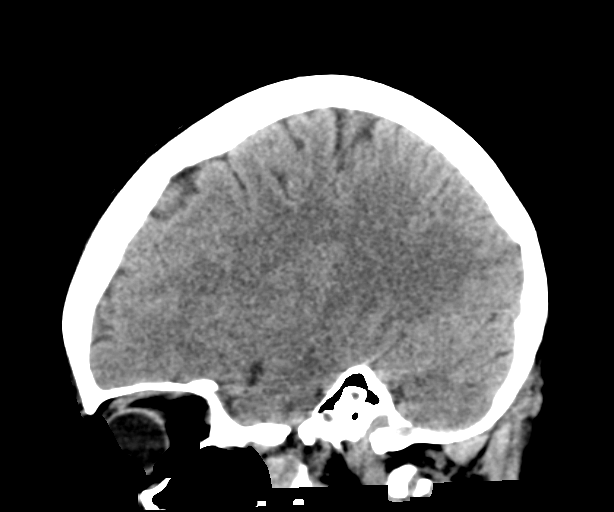

[16 of 47 positions shown; findings below may reference images not displayed]

FINDINGS: The ventricles and the sulci are appropriate in size for the
patient's age. There is no intracranial hemorrhage. No midline shift
or mass effect identified. The gray-white matter differentiation is
preserved.

The visualized paranasal sinuses and mastoid air cells are well
aerated. The calvarium is intact.
IMPRESSION: No acute intracranial pathology.

## 2017-09-05 ENCOUNTER — Emergency Department (HOSPITAL_COMMUNITY): Payer: BLUE CROSS/BLUE SHIELD

## 2017-09-05 ENCOUNTER — Encounter (HOSPITAL_COMMUNITY): Payer: Self-pay

## 2017-09-05 ENCOUNTER — Emergency Department (HOSPITAL_COMMUNITY)
Admission: EM | Admit: 2017-09-05 | Discharge: 2017-09-05 | Disposition: A | Discharge status: Home / Self Care | Payer: BLUE CROSS/BLUE SHIELD | Attending: Emergency Medicine | Admitting: Emergency Medicine

## 2017-09-05 ENCOUNTER — Other Ambulatory Visit: Payer: Self-pay

## 2017-09-05 DIAGNOSIS — Z79899 Other long term (current) drug therapy: Secondary | ICD-10-CM | POA: Insufficient documentation

## 2017-09-05 DIAGNOSIS — R51 Headache: Secondary | ICD-10-CM | POA: Diagnosis present

## 2017-09-05 DIAGNOSIS — H9311 Tinnitus, right ear: Secondary | ICD-10-CM | POA: Diagnosis not present

## 2017-09-05 DIAGNOSIS — R200 Anesthesia of skin: Secondary | ICD-10-CM | POA: Insufficient documentation

## 2017-09-05 DIAGNOSIS — E039 Hypothyroidism, unspecified: Secondary | ICD-10-CM | POA: Insufficient documentation

## 2017-09-05 DIAGNOSIS — G43809 Other migraine, not intractable, without status migrainosus: Secondary | ICD-10-CM

## 2017-09-05 DIAGNOSIS — G43909 Migraine, unspecified, not intractable, without status migrainosus: Secondary | ICD-10-CM | POA: Diagnosis not present

## 2017-09-05 LAB — CBC WITH DIFFERENTIAL/PLATELET
BASOS ABS: 0 10*3/uL (ref 0.0–0.1)
Basophils Relative: 0 %
EOS PCT: 1 %
Eosinophils Absolute: 0.1 10*3/uL (ref 0.0–0.7)
HCT: 30.4 % — ABNORMAL LOW (ref 36.0–46.0)
Hemoglobin: 9.9 g/dL — ABNORMAL LOW (ref 12.0–15.0)
LYMPHS PCT: 17 %
Lymphs Abs: 1 10*3/uL (ref 0.7–4.0)
MCH: 24.5 pg — AB (ref 26.0–34.0)
MCHC: 32.6 g/dL (ref 30.0–36.0)
MCV: 75.2 fL — AB (ref 78.0–100.0)
Monocytes Absolute: 0.6 10*3/uL (ref 0.1–1.0)
Monocytes Relative: 11 %
NEUTROS ABS: 4.2 10*3/uL (ref 1.7–7.7)
Neutrophils Relative %: 71 %
Platelets: 318 10*3/uL (ref 150–400)
RBC: 4.04 MIL/uL (ref 3.87–5.11)
RDW: 15.4 % (ref 11.5–15.5)
WBC: 5.9 10*3/uL (ref 4.0–10.5)

## 2017-09-05 LAB — I-STAT BETA HCG BLOOD, ED (MC, WL, AP ONLY): HCG, QUANTITATIVE: 8.8 m[IU]/mL — AB (ref <5)

## 2017-09-05 LAB — BASIC METABOLIC PANEL
Anion gap: 8 (ref 5–15)
BUN: 11 mg/dL (ref 6–20)
CALCIUM: 9 mg/dL (ref 8.9–10.3)
CO2: 23 mmol/L (ref 22–32)
Chloride: 105 mmol/L (ref 101–111)
Creatinine, Ser: 0.76 mg/dL (ref 0.44–1.00)
GFR calc Af Amer: >60 mL/min (ref >60)
GFR calc non Af Amer: >60 mL/min (ref >60)
GLUCOSE: 88 mg/dL (ref 65–99)
Potassium: 3.9 mmol/L (ref 3.5–5.1)
SODIUM: 136 mmol/L (ref 135–145)

## 2017-09-05 LAB — HCG, QUANTITATIVE, PREGNANCY: hCG, Beta Chain, Quant, S: 1 m[IU]/mL (ref <5)

## 2017-09-05 MED ORDER — IOPAMIDOL (ISOVUE-370) INJECTION 76%
INTRAVENOUS | Status: AC
  Administered 2017-09-05: 50 mL
  Filled 2017-09-05: qty 50

## 2017-09-05 MED ORDER — DEXAMETHASONE SODIUM PHOSPHATE 10 MG/ML IJ SOLN
10.0000 mg | Freq: Once | INTRAMUSCULAR | Status: AC
  Administered 2017-09-05: 10 mg via INTRAVENOUS
  Filled 2017-09-05: qty 1

## 2017-09-05 MED ORDER — KETOROLAC TROMETHAMINE 15 MG/ML IJ SOLN
15.0000 mg | Freq: Once | INTRAMUSCULAR | Status: AC
  Administered 2017-09-05: 15 mg via INTRAVENOUS
  Filled 2017-09-05: qty 1

## 2017-09-05 MED ORDER — DIPHENHYDRAMINE HCL 50 MG/ML IJ SOLN
25.0000 mg | Freq: Once | INTRAMUSCULAR | Status: DC
  Filled 2017-09-05: qty 1

## 2017-09-05 MED ORDER — PROCHLORPERAZINE EDISYLATE 5 MG/ML IJ SOLN
10.0000 mg | Freq: Once | INTRAMUSCULAR | Status: AC
Start: 2017-09-05 — End: 2017-09-05
  Administered 2017-09-05: 10 mg via INTRAVENOUS
  Filled 2017-09-05: qty 2

## 2017-09-05 NOTE — Discharge Instructions (Signed)
Please f/u with your PCP for recheck of you hemoglobin. You are slightly anemic and should take an iron pill once a day until you see your doctor.

## 2017-09-05 NOTE — ED Triage Notes (Signed)
Per Pt, Pt is coming from home with complaints of posterior right head pain, ringing in her right ears, and some numbness to the right face. Pt called PCP and was asked to come in to be evaluated.

## 2017-09-05 NOTE — ED Notes (Signed)
Pt states that she would like to be d/c. Benadryl was not given after discussing with pt that it will make her sleepy, pt does not wish to call for someone to pick her up and does not want to take this medication.  EDP notified

## 2017-09-05 NOTE — ED Notes (Signed)
Sent add on label to main lab for [facility] blood test

## 2017-09-05 NOTE — ED Provider Notes (Signed)
[facility] DEPARTMENT Provider Note   CSN: [id number] Arrival date & time: 09/05/17  1727     History   Chief Complaint Chief Complaint  Patient presents with  . Headache  . Tinnitus    HPI Caitlin Mack is a 46 y.o. female.  HPI 87yoF with hx of hypothyroidism and insomnia started on trazodone 3 weeks ago presenting with right sided neck pain since Tuesday that is a constant sharp pain that is not worse on movement or palpation. Also complains of a right occipital aching headache has been constant for the past couple days, gradual in onset over a few days, 7/10, not described as worst of life. Also complains of intermittent pulsatile tinnitus in the right ear, tingling to her right face and intermittent dizziness.  No recent head trauma or falls.  No recent fevers chills.  Denies chest pain, shortness breath, abdominal pain, nausea, vomiting, diarrhea.  No alleviating or exacerbating factors.  She called her doctor about the symptoms and they told her to come here for further evaluation.  Past Medical History:  Diagnosis Date  . Chronic abdominal pain   . GERD (gastroesophageal reflux disease)   . Liver tumor   . Thyroid disease    hypothyroid    There are no active problems to display for this patient.   Past Surgical History:  Procedure Laterality Date  . COLONOSCOPY      OB History    No data available       Home Medications    Prior to Admission medications   Medication Sig Start Date End Date Taking? Authorizing Provider  acetaminophen (TYLENOL) 500 MG tablet Take 1 tablet (500 mg total) by mouth every 6 (six) hours as needed. Patient taking differently: Take 1,000 mg by mouth every 6 (six) hours as needed (for pain or headaches).  11/18/15  Yes Antonietta Breach, PA-C  calcium carbonate (TUMS - DOSED IN MG ELEMENTAL CALCIUM) 500 MG chewable tablet Chew 500 mg by mouth daily as needed for indigestion or heartburn. WHEN PROTONIX IS  INEFFECTIVE   Yes [provider]  dicyclomine (BENTYL) 10 MG capsule Take 10 mg by mouth daily as needed for spasms.  06/25/16  Yes [provider]  ibuprofen (ADVIL,MOTRIN) 200 MG tablet Take 800 mg by mouth every 6 (six) hours as needed (for pain or headaches).   Yes [provider]  levothyroxine (SYNTHROID, LEVOTHROID) 88 MCG tablet Take 88 mcg by mouth daily before breakfast.   Yes [provider]  meclizine (ANTIVERT) 12.5 MG tablet Take 2 tablets (25 mg total) by mouth 3 (three) times daily as needed for dizziness. 03/18/16  Yes Delsa Grana, PA-C  Multiple Vitamins-Calcium (ONE-A-DAY WOMENS PO) Take 1 tablet by mouth 2 (two) times a week.   Yes [provider]  pantoprazole (PROTONIX) 40 MG tablet Take 40 mg by mouth daily as needed (for reflux).  08/28/17  Yes [provider]  traZODone (DESYREL) 150 MG tablet Take 150 mg by mouth at bedtime as needed for sleep.   Yes [provider]  ondansetron (ZOFRAN ODT) 4 MG disintegrating tablet Take 1 tablet (4 mg total) by mouth every 8 (eight) hours as needed for nausea or vomiting. Patient not taking: Reported on 09/05/2017 09/20/16   Nona Dell, PA-C  prochlorperazine (COMPAZINE) 10 MG tablet Take 1 tablet (10 mg total) by mouth 2 (two) times daily as needed (headache). Patient not taking: Reported on 09/05/2017 03/15/16   Joseph Berkshire  J, MD  traMADol (ULTRAM) 50 MG tablet Take 1 tablet (50 mg total) by mouth every 6 (six) hours as needed. Patient not taking: Reported on 09/05/2017 09/20/16   Nona Dell, PA-C    Family History No family history on file.  Social History Social History   Tobacco Use  . Smoking status: Never Smoker  . Smokeless tobacco: Never Used  Substance Use Topics  . Alcohol use: Yes    Comment: Once a month, Occasionally   . Drug use: No     Allergies   Citalopram; Duloxetine hcl; and Omeprazole   Review of  Systems Review of Systems  Constitutional: Negative for chills and fever.  HENT: Negative for ear pain and sore throat.   Eyes: Negative for pain and visual disturbance.  Respiratory: Negative for cough and shortness of breath.   Cardiovascular: Negative for chest pain and palpitations.  Gastrointestinal: Negative for abdominal pain and vomiting.  Genitourinary: Negative for dysuria and hematuria.  Musculoskeletal: Positive for neck pain. Negative for arthralgias and back pain.  Skin: Negative for color change and rash.  Neurological: Positive for dizziness and headaches. Negative for seizures, syncope, weakness and numbness.  All other systems reviewed and are negative.    Physical Exam Updated Vital Signs BP (!) 102/54   Pulse 70   Temp 98.3 F (36.8 C) (Oral)   Resp 16   Ht 5' (1.524 m)   Wt 59 kg (130 lb)   LMP 08/30/2017   SpO2 98%   BMI 25.39 kg/m   Physical Exam  Constitutional: She is oriented to person, place, and time. She appears well-developed and well-nourished. No distress.  HENT:  Head: Normocephalic and atraumatic.  Eyes: Conjunctivae and EOM are normal. Pupils are equal, round, and reactive to light.  Neck: Normal range of motion and full passive range of motion without pain. Neck supple. Normal carotid pulses present. No spinous process tenderness present. Carotid bruit is not present. No neck rigidity. Normal range of motion present.    Cardiovascular: Normal rate and regular rhythm.  No murmur heard. Pulmonary/Chest: Effort normal and breath sounds normal. No respiratory distress.  Abdominal: Soft. There is no tenderness.  Musculoskeletal: She exhibits no edema.  Neurological: She is alert and oriented to person, place, and time. She has normal reflexes. GCS eye subscore is 4. GCS verbal subscore is 5. GCS motor subscore is 6.  CN2-12 grossly intact, 5/5 strength in flexion and extension of major joints on all extremities. Sensation intact throughout.  Coordination intact to finger-nose-finger and heel-shin. Gait normal.  Skin: Skin is warm and dry.  Psychiatric: She has a normal mood and affect.  Nursing note and vitals reviewed.    ED Treatments / Results  Labs (all labs ordered are listed, but only abnormal results are displayed) Labs Reviewed  CBC WITH DIFFERENTIAL/PLATELET - Abnormal; Notable for the following components:      Result Value   Hemoglobin 9.9 (*)    HCT 30.4 (*)    MCV 75.2 (*)    MCH 24.5 (*)    All other components within normal limits  I-STAT BETA HCG BLOOD, ED ([facility], [facility], [facility] ONLY) - Abnormal; Notable for the following components:   I-stat hCG, quantitative 8.8 (*)    All other components within normal limits  BASIC METABOLIC PANEL  HCG, QUANTITATIVE, PREGNANCY    EKG  EKG Interpretation None       Radiology Ct Angio Head W Or Wo Contrast  Result Date: 09/05/2017  CLINICAL DATA:  Pulsatile tinnitus in the right ear. Right facial numbness. Headache. EXAM: CT ANGIOGRAPHY HEAD AND NECK TECHNIQUE: Multidetector CT imaging of the head and neck was performed using the standard protocol during bolus administration of intravenous contrast. Multiplanar CT image reconstructions and MIPs were obtained to evaluate the vascular anatomy. Carotid stenosis measurements (when applicable) are obtained utilizing NASCET criteria, using the distal internal carotid diameter as the denominator. CONTRAST:  53mL ISOVUE-370 IOPAMIDOL (ISOVUE-370) INJECTION 76% COMPARISON:  Head CT 03/18/2016 FINDINGS: CT HEAD FINDINGS Brain: No mass lesion, intraparenchymal hemorrhage or extra-axial collection. No evidence of acute cortical infarct. Brain parenchyma and CSF-containing spaces are normal for age. Vascular: No hyperdense vessel or unexpected calcification. Skull: Normal visualized skull base, calvarium and extracranial soft tissues. Sinuses/Orbits: No sinus fluid levels or advanced mucosal thickening. No mastoid effusion. Normal orbits.  CTA NECK FINDINGS Aortic arch: There is no calcific atherosclerosis of the aortic arch. There is no aneurysm, dissection or hemodynamically significant stenosis of the visualized ascending aorta and aortic arch. Conventional 3 vessel aortic branching pattern. The visualized proximal subclavian arteries are normal. Right carotid system: The right common carotid origin is widely patent. There is no common carotid or internal carotid artery dissection or aneurysm. No hemodynamically significant stenosis. Left carotid system: The left common carotid origin is widely patent. There is no common carotid or internal carotid artery dissection or aneurysm. No hemodynamically significant stenosis. Vertebral arteries: The vertebral system is right dominant. Both vertebral artery origins are normal. Both vertebral arteries are normal to their confluence with the basilar artery. Skeleton: There is no bony spinal canal stenosis. No lytic or blastic lesions. Other neck: The nasopharynx is clear. The oropharynx and hypopharynx are normal. The epiglottis is normal. The supraglottic larynx, glottis and subglottic larynx are normal. No retropharyngeal collection. The parapharyngeal spaces are preserved. The parotid and submandibular glands are normal. No sialolithiasis or salivary ductal dilatation. The thyroid gland is normal. There is no cervical lymphadenopathy. Upper chest: No pneumothorax or pleural effusion. No nodules or masses. Review of the MIP images confirms the above findings CTA HEAD FINDINGS Anterior circulation: --Intracranial internal carotid arteries: Normal. Normal carotid course at the skullbase bilaterally. --Anterior cerebral arteries: Normal. --Middle cerebral arteries: Normal. --Posterior communicating arteries: Absent bilaterally. Posterior circulation: --Posterior cerebral arteries: Normal. --Superior cerebellar arteries: Normal. --Basilar artery: Normal. --Anterior inferior cerebellar arteries: Normal.  --Posterior inferior cerebellar arteries: Normal. Venous sinuses: As permitted by contrast timing, patent. No high-riding jugular venous bulb. Anatomic variants: None Delayed phase: No parenchymal contrast enhancement. Review of the MIP images confirms the above findings IMPRESSION: Normal CTA of the head and neck. Electronically Signed   By: Ulyses Jarred M.D.   On: 09/05/2017 21:05   Ct Angio Neck W And/or Wo Contrast  Result Date: 09/05/2017 CLINICAL DATA:  Pulsatile tinnitus in the right ear. Right facial numbness. Headache. EXAM: CT ANGIOGRAPHY HEAD AND NECK TECHNIQUE: Multidetector CT imaging of the head and neck was performed using the standard protocol during bolus administration of intravenous contrast. Multiplanar CT image reconstructions and MIPs were obtained to evaluate the vascular anatomy. Carotid stenosis measurements (when applicable) are obtained utilizing NASCET criteria, using the distal internal carotid diameter as the denominator. CONTRAST:  53mL ISOVUE-370 IOPAMIDOL (ISOVUE-370) INJECTION 76% COMPARISON:  Head CT 03/18/2016 FINDINGS: CT HEAD FINDINGS Brain: No mass lesion, intraparenchymal hemorrhage or extra-axial collection. No evidence of acute cortical infarct. Brain parenchyma and CSF-containing spaces are normal for age. Vascular: No hyperdense vessel or unexpected calcification. Skull: Normal  visualized skull base, calvarium and extracranial soft tissues. Sinuses/Orbits: No sinus fluid levels or advanced mucosal thickening. No mastoid effusion. Normal orbits. CTA NECK FINDINGS Aortic arch: There is no calcific atherosclerosis of the aortic arch. There is no aneurysm, dissection or hemodynamically significant stenosis of the visualized ascending aorta and aortic arch. Conventional 3 vessel aortic branching pattern. The visualized proximal subclavian arteries are normal. Right carotid system: The right common carotid origin is widely patent. There is no common carotid or internal  carotid artery dissection or aneurysm. No hemodynamically significant stenosis. Left carotid system: The left common carotid origin is widely patent. There is no common carotid or internal carotid artery dissection or aneurysm. No hemodynamically significant stenosis. Vertebral arteries: The vertebral system is right dominant. Both vertebral artery origins are normal. Both vertebral arteries are normal to their confluence with the basilar artery. Skeleton: There is no bony spinal canal stenosis. No lytic or blastic lesions. Other neck: The nasopharynx is clear. The oropharynx and hypopharynx are normal. The epiglottis is normal. The supraglottic larynx, glottis and subglottic larynx are normal. No retropharyngeal collection. The parapharyngeal spaces are preserved. The parotid and submandibular glands are normal. No sialolithiasis or salivary ductal dilatation. The thyroid gland is normal. There is no cervical lymphadenopathy. Upper chest: No pneumothorax or pleural effusion. No nodules or masses. Review of the MIP images confirms the above findings CTA HEAD FINDINGS Anterior circulation: --Intracranial internal carotid arteries: Normal. Normal carotid course at the skullbase bilaterally. --Anterior cerebral arteries: Normal. --Middle cerebral arteries: Normal. --Posterior communicating arteries: Absent bilaterally. Posterior circulation: --Posterior cerebral arteries: Normal. --Superior cerebellar arteries: Normal. --Basilar artery: Normal. --Anterior inferior cerebellar arteries: Normal. --Posterior inferior cerebellar arteries: Normal. Venous sinuses: As permitted by contrast timing, patent. No high-riding jugular venous bulb. Anatomic variants: None Delayed phase: No parenchymal contrast enhancement. Review of the MIP images confirms the above findings IMPRESSION: Normal CTA of the head and neck. Electronically Signed   By: Ulyses Jarred M.D.   On: 09/05/2017 21:05    Procedures Procedures (including  critical care time)  Medications Ordered in ED Medications  diphenhydrAMINE (BENADRYL) injection 25 mg (25 mg Intravenous Not Given 09/05/17 2247)  iopamidol (ISOVUE-370) 76 % injection (50 mLs  Contrast Given 09/05/17 2027)  prochlorperazine (COMPAZINE) injection 10 mg (10 mg Intravenous Given 09/05/17 2247)  ketorolac (TORADOL) 15 MG/ML injection 15 mg (15 mg Intravenous Given 09/05/17 2247)  dexamethasone (DECADRON) injection 10 mg (10 mg Intravenous Given 09/05/17 2252)     Initial Impression / Assessment and Plan / ED Course  I have reviewed the triage vital signs and the nursing notes.  Pertinent labs & imaging results that were available during my care of the patient were reviewed by me and considered in my medical decision making (see chart for details).     46 year old female with the above history presenting with right neck pain, intermittent tenderness, intermittent dizziness, headache and right face tingling.  Afebrile and hemodynamically stable.  Mild tenderness on the right anterior neck but full range of motion.  Signs of meningismus.  Normal neurologic exam.  Due to her right-sided neck pain that is right over the carotid, intermittent pulsatile tinnitus, dizziness and headache that is all on the right side, concern for carotid artery dissection.  CTA head neck ordered.  Also on the differential, muscular skeletal pain, peripheral vertigo, vestibular neuritis.  Basic labs ordered.  Pregnancy test negative.  Basic labs grossly unremarkable other than mild anemia.  Patient denies any recent hemoptysis, hematemesis, hematochezia  or melena or vaginal bleeding.  Will instruct patient to take an OTC iron pill and follow-up with her PCP for lab recheck.  CTA negative for carotid artery dissection or other acute abnormality.  She was given Compazine, Toradol, Decadron for her headache.  Decadron also beneficial if she has vestibular neuritis.  No emergent cause of her symptoms found.   We will have the patient follow-up with her PCP for reevaluation.  The patient amenable with this plan.  Strict return precautions given.   Final Clinical Impressions(s) / ED Diagnoses   Final diagnoses:  Tinnitus of right ear  Other migraine without status migrainosus, not intractable    ED Discharge Orders    None       Keymora Grillot [place], MD 09/05/17 2305    Rex Kras Wenda Overland, MD 09/12/17 [phone]

## 2018-04-27 IMAGING — US US ABDOMEN LIMITED
1 series · 14 of 25 positions shown · non-contrast
Comparison: CT abdomen and pelvis 11/18/2015

CLINICAL DATA: Right upper quadrant pain.

EXAM:
US ABDOMEN LIMITED - RIGHT UPPER QUADRANT

[Series 1: us abdomen limited · 0.20mm/px · 14 of 68 slices shown]
[im 1/68]
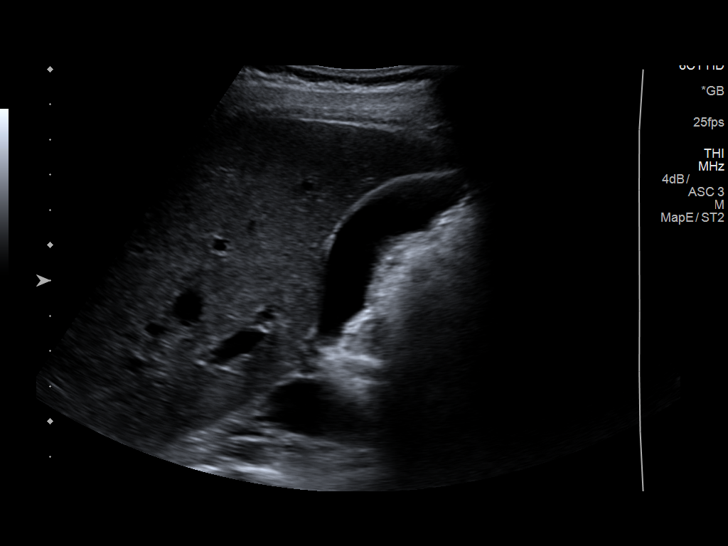
[im 6/68]
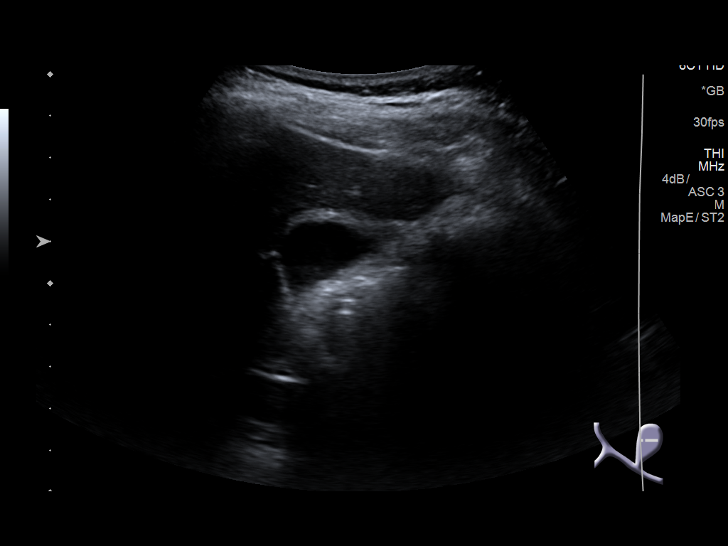
[im 12/68]
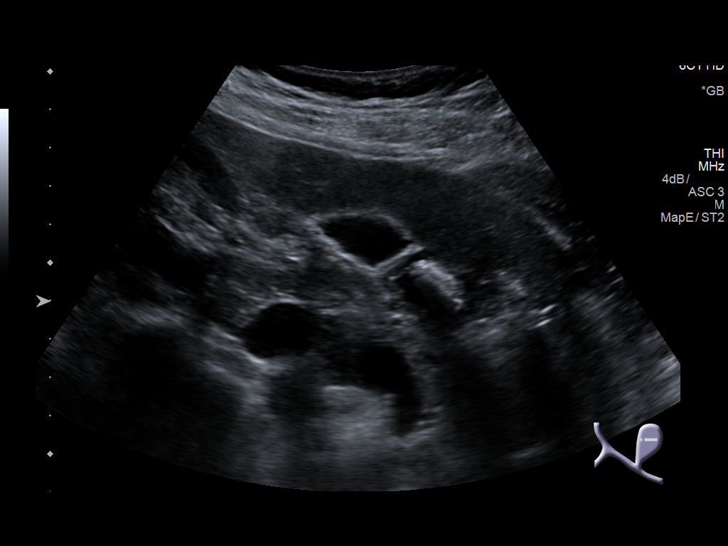
[im 17/68]
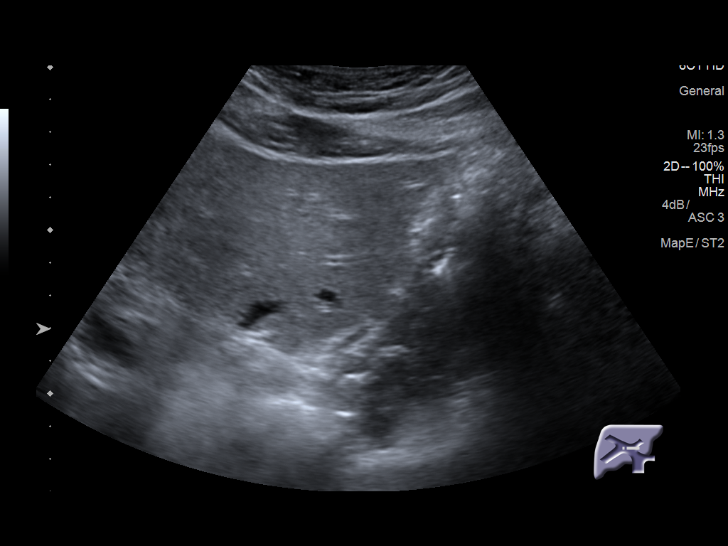
[im 23/68]
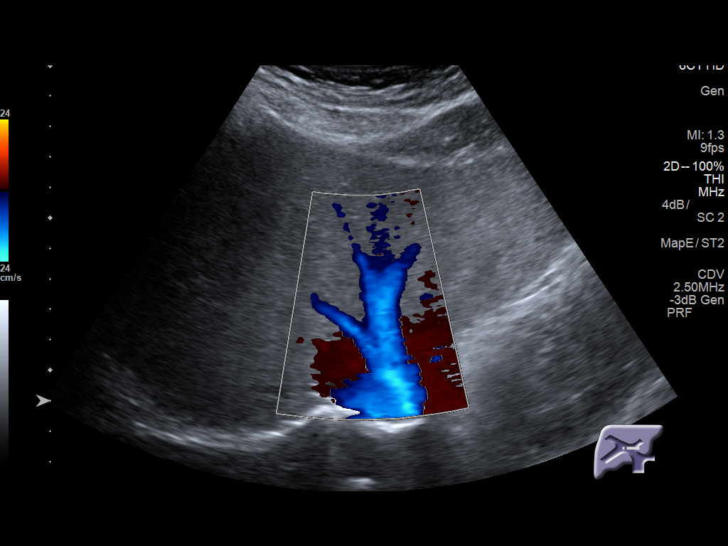
[im 26/68]
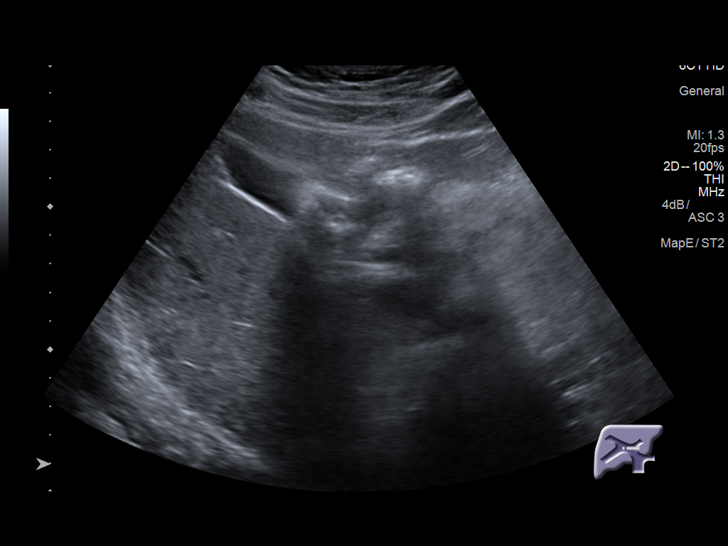
[im 31/68]
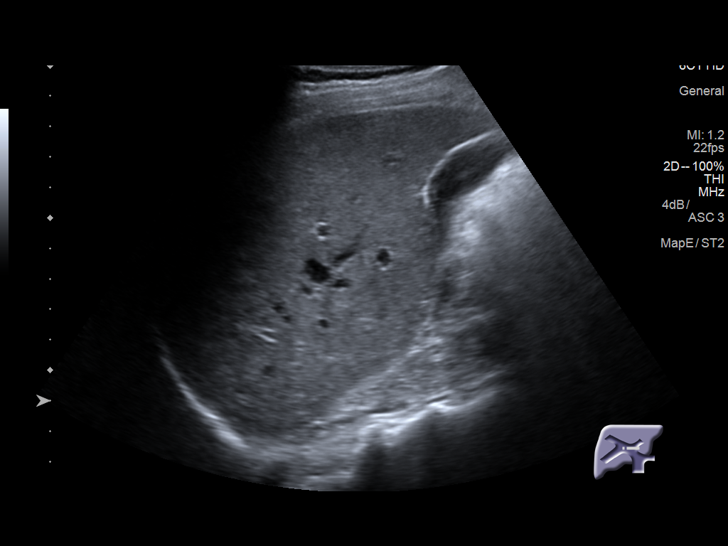
[im 37/68]
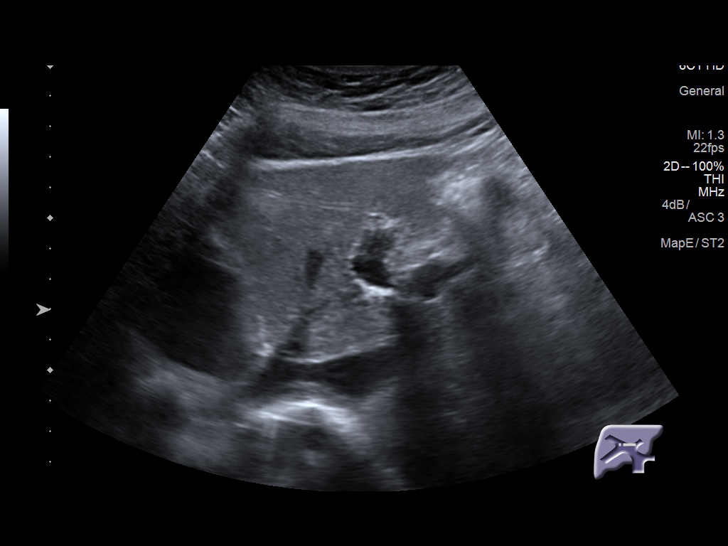
[im 42/68]
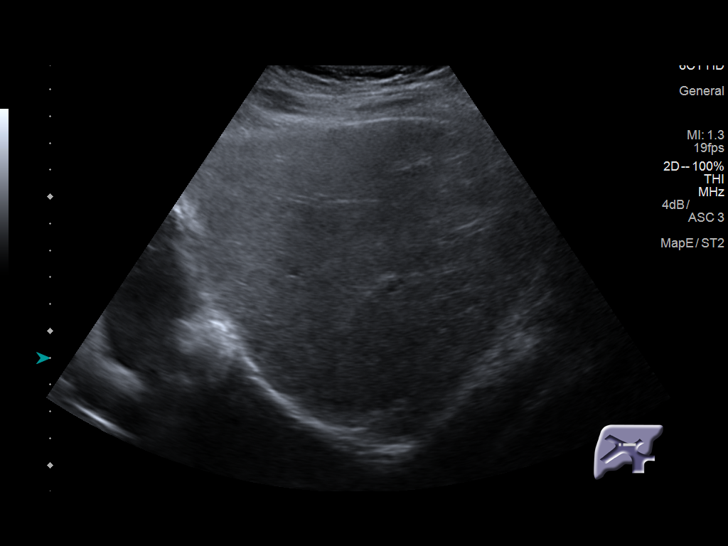
[im 45/68]
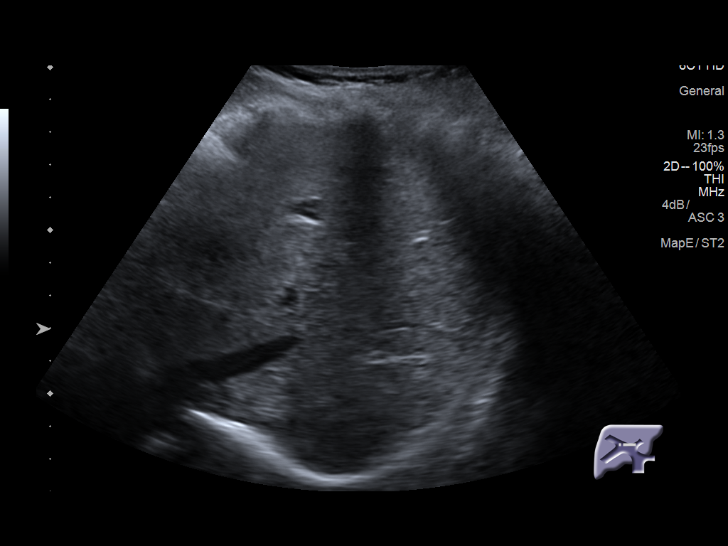
[im 51/68]
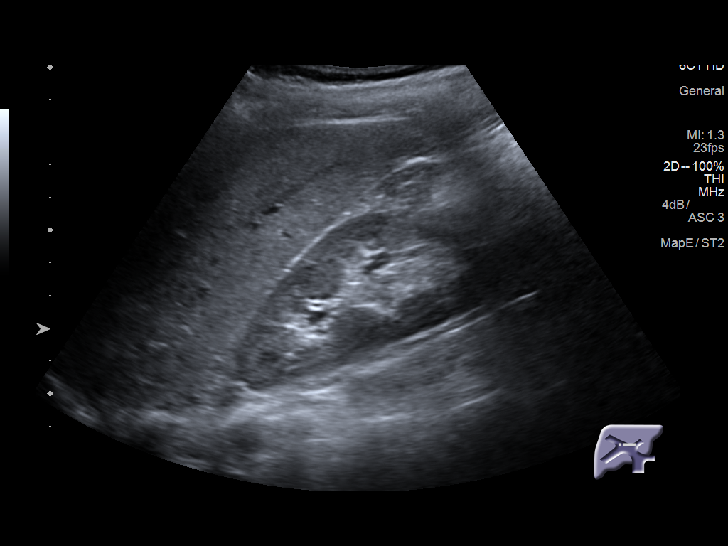
[im 56/68]
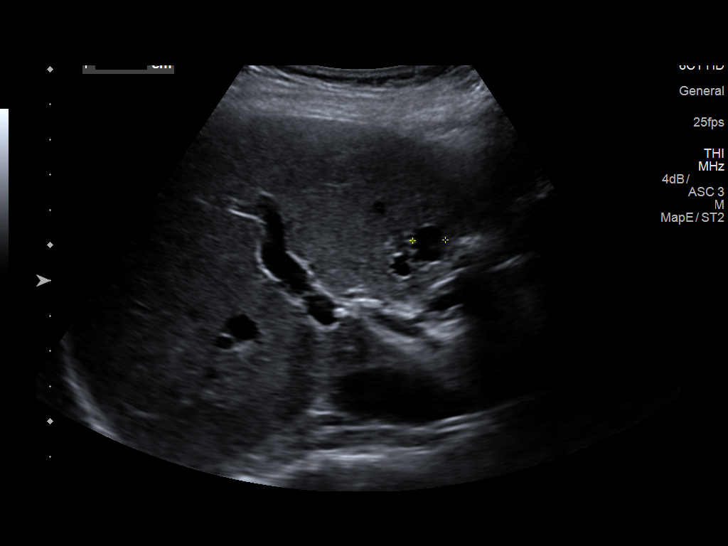
[im 62/68]
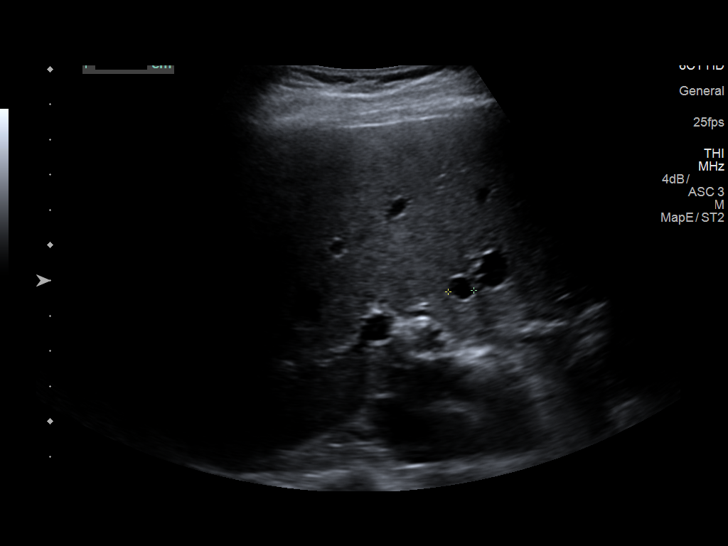
[im 68/68]
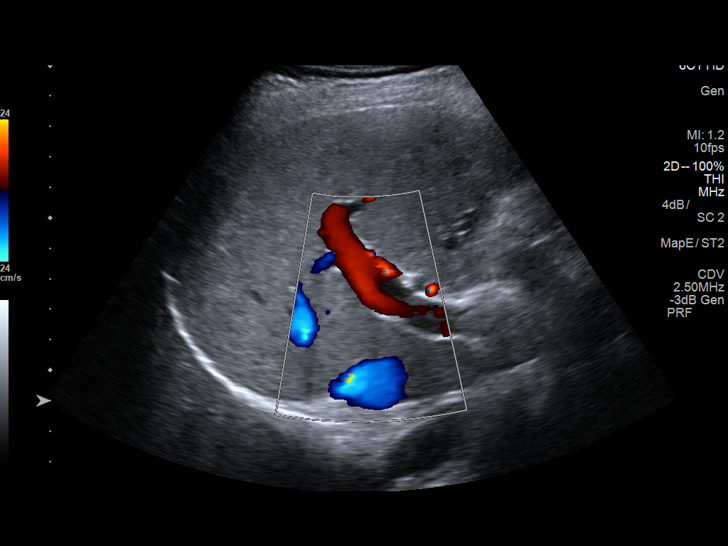

[14 of 25 positions shown; findings below may reference images not displayed]

FINDINGS: Gallbladder:

No gallstones or wall thickening visualized. No sonographic Murphy
sign noted by sonographer.

Common bile duct:

Diameter: 2 mm

Liver:

Two cysts measure 1.1 cm and 0.7 cm and likely correspond to the
low-density lesions in segment IV on the prior CT, not significantly
changed in size. There is also a 1.0 cm hyperechoic lesion in the
right hepatic lobe which likely corresponds with the 8 mm mildly
hypoattenuating lesion in segment VIII on the prior CT.
IMPRESSION: 1. Unremarkable appearance of the gallbladder.
2. Two small unchanged hepatic cysts.
3. 1 cm hyperechoic liver lesion, likely not significantly changed
in size from the prior CT. If the patient has no history of
malignancy, a benign lesion such as hemangioma is favored, and a
follow-up ultrasound could be performed in 6 months. If the patient
has a history of malignancy, further characterization is recommended
with abdominal MRI.
